# Patient Record
Sex: Male | Born: 1960
Health system: Southern US, Community
[De-identification: ages and names within clinical notes are randomized; demographics above are authoritative.]

## PROBLEM LIST (undated history)

## (undated) DIAGNOSIS — E785 Hyperlipidemia, unspecified: Secondary | ICD-10-CM

## (undated) DIAGNOSIS — I10 Essential (primary) hypertension: Secondary | ICD-10-CM

## (undated) DIAGNOSIS — R600 Localized edema: Secondary | ICD-10-CM

## (undated) DIAGNOSIS — M25579 Pain in unspecified ankle and joints of unspecified foot: Secondary | ICD-10-CM

## (undated) DIAGNOSIS — Z5189 Encounter for other specified aftercare: Secondary | ICD-10-CM

## (undated) DIAGNOSIS — Z86010 Personal history of colonic polyps: Secondary | ICD-10-CM

## (undated) DIAGNOSIS — Z0000 Encounter for general adult medical examination without abnormal findings: Secondary | ICD-10-CM

## (undated) DIAGNOSIS — K5790 Diverticulosis of intestine, part unspecified, without perforation or abscess without bleeding: Secondary | ICD-10-CM

## (undated) DIAGNOSIS — T7840XA Allergy, unspecified, initial encounter: Secondary | ICD-10-CM

## (undated) HISTORY — DX: Hyperlipidemia, unspecified: E78.5

## (undated) HISTORY — PX: OTHER SURGICAL HISTORY: SHX169

## (undated) HISTORY — DX: Personal history of colonic polyps: Z86.010

## (undated) HISTORY — DX: Localized edema: R60.0

## (undated) HISTORY — DX: Allergy, unspecified, initial encounter: T78.40XA

## (undated) HISTORY — DX: Diverticulosis of intestine, part unspecified, without perforation or abscess without bleeding: K57.90

## (undated) HISTORY — DX: Essential (primary) hypertension: I10

## (undated) HISTORY — DX: Pain in unspecified ankle and joints of unspecified foot: M25.579

## (undated) HISTORY — DX: Encounter for general adult medical examination without abnormal findings: Z00.00

## (undated) HISTORY — DX: Encounter for other specified aftercare: Z51.89

---
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
## 1983-05-25 DIAGNOSIS — Z5189 Encounter for other specified aftercare: Secondary | ICD-10-CM

## 1983-05-25 DIAGNOSIS — IMO0001 Reserved for inherently not codable concepts without codable children: Secondary | ICD-10-CM

## 1983-05-25 HISTORY — DX: Encounter for other specified aftercare: Z51.89

## 1983-05-25 HISTORY — PX: ORIF FEMUR FRACTURE- LISS PLATE: SHX2120

## 1983-05-25 HISTORY — PX: FRACTURE SURGERY: SHX138

## 1983-05-25 HISTORY — DX: Reserved for inherently not codable concepts without codable children: IMO0001

## 1998-07-07 ENCOUNTER — Observation Stay (HOSPITAL_COMMUNITY): Admission: EM | Admit: 1998-07-07 | Discharge: 1998-07-08 | Payer: Self-pay | Admitting: Emergency Medicine

## 1998-07-07 ENCOUNTER — Ambulatory Visit (HOSPITAL_COMMUNITY): Admission: RE | Admit: 1998-07-07 | Discharge: 1998-07-07 | Payer: Self-pay | Admitting: Orthopedic Surgery

## 1998-07-09 ENCOUNTER — Ambulatory Visit (HOSPITAL_COMMUNITY): Admission: RE | Admit: 1998-07-09 | Discharge: 1998-07-09 | Payer: Self-pay | Admitting: Unknown Physician Specialty

## 1998-07-10 ENCOUNTER — Encounter: Admission: RE | Admit: 1998-07-10 | Discharge: 1998-07-10 | Payer: Self-pay | Admitting: Family Medicine

## 1998-07-10 ENCOUNTER — Encounter (HOSPITAL_COMMUNITY): Admission: RE | Admit: 1998-07-10 | Discharge: 1998-10-08 | Payer: Self-pay

## 1998-07-14 ENCOUNTER — Encounter: Admission: RE | Admit: 1998-07-14 | Discharge: 1998-07-14 | Payer: Self-pay | Admitting: Family Medicine

## 1998-07-16 ENCOUNTER — Encounter: Admission: RE | Admit: 1998-07-16 | Discharge: 1998-07-16 | Payer: Self-pay | Admitting: Family Medicine

## 1998-07-21 ENCOUNTER — Encounter: Admission: RE | Admit: 1998-07-21 | Discharge: 1998-07-21 | Payer: Self-pay | Admitting: Family Medicine

## 1998-07-29 ENCOUNTER — Encounter: Admission: RE | Admit: 1998-07-29 | Discharge: 1998-07-29 | Payer: Self-pay | Admitting: Sports Medicine

## 1998-08-13 ENCOUNTER — Encounter: Admission: RE | Admit: 1998-08-13 | Discharge: 1998-08-13 | Payer: Self-pay | Admitting: Family Medicine

## 2009-07-02 ENCOUNTER — Ambulatory Visit: Payer: Self-pay | Admitting: Internal Medicine

## 2009-07-02 DIAGNOSIS — R030 Elevated blood-pressure reading, without diagnosis of hypertension: Secondary | ICD-10-CM | POA: Insufficient documentation

## 2009-07-02 DIAGNOSIS — R0989 Other specified symptoms and signs involving the circulatory and respiratory systems: Secondary | ICD-10-CM | POA: Insufficient documentation

## 2009-07-02 DIAGNOSIS — R0609 Other forms of dyspnea: Secondary | ICD-10-CM | POA: Insufficient documentation

## 2009-07-03 ENCOUNTER — Encounter: Payer: Self-pay | Admitting: Internal Medicine

## 2009-07-09 ENCOUNTER — Telehealth: Payer: Self-pay | Admitting: Internal Medicine

## 2009-07-09 ENCOUNTER — Ambulatory Visit: Payer: Self-pay | Admitting: Internal Medicine

## 2009-07-27 ENCOUNTER — Encounter: Payer: Self-pay | Admitting: Internal Medicine

## 2009-07-27 ENCOUNTER — Ambulatory Visit (HOSPITAL_BASED_OUTPATIENT_CLINIC_OR_DEPARTMENT_OTHER): Admission: RE | Admit: 2009-07-27 | Discharge: 2009-07-27 | Payer: Self-pay | Admitting: Internal Medicine

## 2009-08-05 ENCOUNTER — Ambulatory Visit: Payer: Self-pay | Admitting: Pulmonary Disease

## 2009-08-07 ENCOUNTER — Encounter (INDEPENDENT_AMBULATORY_CARE_PROVIDER_SITE_OTHER): Payer: Self-pay | Admitting: *Deleted

## 2009-08-07 ENCOUNTER — Telehealth: Payer: Self-pay | Admitting: Internal Medicine

## 2009-08-07 DIAGNOSIS — G4733 Obstructive sleep apnea (adult) (pediatric): Secondary | ICD-10-CM | POA: Insufficient documentation

## 2009-09-04 ENCOUNTER — Ambulatory Visit: Payer: Self-pay | Admitting: Pulmonary Disease

## 2010-06-21 LAB — CONVERTED CEMR LAB
ALT: 10 units/L (ref 0–53)
AST: 16 units/L (ref 0–37)
BUN: 12 mg/dL (ref 6–23)
Bilirubin, Direct: 0.1 mg/dL (ref 0.0–0.3)
CO2: 25 meq/L (ref 19–32)
Calcium: 9.4 mg/dL (ref 8.4–10.5)
Cholesterol: 138 mg/dL (ref 0–200)
Glucose, Bld: 84 mg/dL (ref 70–99)
HCT: 40.1 % (ref 39.0–52.0)
Hemoglobin: 13.5 g/dL (ref 13.0–17.0)
Indirect Bilirubin: 0.4 mg/dL (ref 0.0–0.9)
MCHC: 33.7 g/dL (ref 30.0–36.0)
MCV: 85.1 fL (ref 78.0–100.0)
PSA: 0.73 ng/mL (ref 0.10–4.00)
RBC: 4.71 M/uL (ref 4.22–5.81)
TSH: 1.377 microintl units/mL (ref 0.350–4.500)
Total Bilirubin: 0.5 mg/dL (ref 0.3–1.2)
Total CHOL/HDL Ratio: 3.5
WBC: 4.3 10*3/uL (ref 4.0–10.5)

## 2010-06-23 NOTE — Letter (Signed)
   [place] at [facility] [address] [place], [place]  [address]  [place] Phone: [phone]      July 03, 2009   Mitchell Harrington [address] [place], [place] [address]  RE:  LAB RESULTS  Dear  [name],  The following is an interpretation of your most recent lab tests.  Please take note of any instructions provided or changes to medications that have resulted from your lab work.  PSA:  normal - no follow-up needed PSA: 0.73  ELECTROLYTES:  Good - no changes needed  KIDNEY FUNCTION TESTS:  Good - no changes needed  LIVER FUNCTION TESTS:  Good - no changes needed  LIPID PANEL:  Good - no changes needed Triglyceride: 76   Cholesterol: 138   LDL: 84   HDL: 39   Chol/HDL%:  3.5 Ratio  THYROID STUDIES:  Thyroid studies normal TSH: 1.377     CBC:  Good - no changes needed   Sincerely Yours,    Dr. Thomos Lemons  Appended Document:  Mailed.
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
## 2010-06-23 NOTE — Progress Notes (Signed)
  Phone Note Outgoing Call   Summary of Call: call pt - stool test negative for blood Initial call taken by: [name],  July 09, 2009 11:59 AM  Follow-up for Phone Call        informed pt. that stool test was negative for any blood.Mitchell Harrington  July 09, 2009 1:43 PM

## 2010-06-23 NOTE — Letter (Signed)
Summary: [facility]: Immunoassay Fecal Occult Blood (iFOB) Order Psychologist, counselling at [facility]  [address]   [place], [place] [address]   Phone: [phone]  Fax: [phone]       Lab: Immunoassay Fecal Occult Blood (iFOB) Order Form   July 02, 2009 MRN: [id number]   Mitchell Harrington [birth year]   Physician Name: Dr Thomos Lemons  Diagnosis Code: V70.0      Mitchell Harrington CMA
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
## 2010-06-23 NOTE — Letter (Signed)
Summary: Primary Care Consult Scheduled Letter  [place] at [facility]  [address]   [place], [place] [address]   Phone: [phone]  Fax: [phone]      08/07/2009 MRN: [id number]  Mitchell Harrington [address] [place], [place]  [address]    Dear Mr. Prevette,      We have scheduled an appointment for you.  At the recommendation of Dr.YOO, we have scheduled you a consult with [facility], DR CLANCE  on APRIL 4,2011 at 11AM.  Their address is_[address] ,[place]. The office phone number is [phone].  If this appointment day and time is not convenient for you, please feel free to call the office of the doctor you are being referred to at the number listed above and reschedule the appointment.     It is important for you to keep your scheduled appointments. We are here to make sure you are given good patient care. If you have questions or you have made changes to your appointment, please notify us at  [phone], ask for HELEN.    Thank you,  Patient Care Coordinator [place] [facility]

## 2010-06-23 NOTE — Progress Notes (Signed)
Summary: Sleep Study  Phone Note Call from Patient Call back at [facility] Phone [phone]   Caller: Patient Call For: [name] Summary of Call: patient would like to know the results of his sleep study. His next appointment is 09/08/2009. Results are on Dr Artist Pais desk for review Initial call taken by: Glendell Docker CMA,  August 07, 2009 9:24 AM  Follow-up for Phone Call        sleep study shows severe obstructive sleep apnea.  I rec referral to sleep specialist Follow-up by: [name],  August 07, 2009 12:26 PM  Additional Follow-up for Phone Call Additional follow up Details #1::        Pt notified as directed   appt Dr   Shelle Iron  April 4 Additional Follow-up by: Darral Dash,  August 07, 2009 2:06 PM  New Problems: SLEEP APNEA, OBSTRUCTIVE (ICD-327.23)   Additional Follow-up for Phone Call Additional follow up Details #2::    can we send sleep referral to Dr. Vassie Loll Follow-up by: [name],  August 07, 2009 5:04 PM  Additional Follow-up for Phone Call Additional follow up Details #3:: Details for Additional Follow-up Action Taken: Changed appt to Dr Vassie Loll   [facility]  April 14  New Problems: SLEEP APNEA, OBSTRUCTIVE (ICD-327.23)

## 2011-07-12 ENCOUNTER — Ambulatory Visit (INDEPENDENT_AMBULATORY_CARE_PROVIDER_SITE_OTHER): Payer: BC Managed Care – PPO | Admitting: Internal Medicine

## 2011-07-12 ENCOUNTER — Encounter: Payer: Self-pay | Admitting: Internal Medicine

## 2011-07-12 DIAGNOSIS — N529 Male erectile dysfunction, unspecified: Secondary | ICD-10-CM | POA: Insufficient documentation

## 2011-07-12 DIAGNOSIS — I10 Essential (primary) hypertension: Secondary | ICD-10-CM

## 2011-07-12 MED ORDER — VARDENAFIL HCL 10 MG PO TABS: 10.0000 mg | ORAL_TABLET | Freq: Every day | ORAL | Status: DC | PRN

## 2011-07-12 NOTE — Assessment & Plan Note (Signed)
Asx. Maintain outpt bp log. Obtain cbc, chem7 and tsh. Schedule close f/u for review of bp log. If remains elevated discuss potential medication.

## 2011-07-12 NOTE — Assessment & Plan Note (Signed)
Obtain chem7 and testosterone. Possible contribution from elevated bp. Attempt levitra prn. Dosing instructions provided.

## 2011-07-12 NOTE — Progress Notes (Signed)
  Subjective:    Patient ID: Mitchell Harrington, male    DOB: 09/12/1960, 51 y.o.   MRN: [id number]  HPI Pt presents to clinic for followup of multiple medical problems. C/o one month h/o ED with difficulty achieving and sustaining erections. Has intact libido. No triggers known. H/o elevated bp and bp elevated today. States was nl at time of last cdl evaluation in July. Asx with regard to bp elevation and denies ha, dizziness, cp. Previously declined colonoscopy but may be willing to reconsider later this year. No there complaint.  No past medical history on file. No past surgical history on file.  reports that he has never smoked. He has never used smokeless tobacco. He reports that he does not drink alcohol or use illicit drugs. family history is not on file. Not on File    Review of Systems see hpi    Objective:   Physical Exam  Physical Exam  Nursing note and vitals reviewed. Constitutional: Appears well-developed and well-nourished. No distress.  HENT:  Head: Normocephalic and atraumatic.  Right Ear: External ear normal.  Left Ear: External ear normal.  Eyes: Conjunctivae are normal. No scleral icterus.  Neck: Neck supple. Carotid bruit is not present.  Cardiovascular: Normal rate, regular rhythm and normal heart sounds.  Exam reveals no gallop and no friction rub.   No murmur heard. Pulmonary/Chest: Effort normal and breath sounds normal. No respiratory distress. He has no wheezes. no rales.  Lymphadenopathy:    He has no cervical adenopathy.  Neurological:Alert.  Skin: Skin is warm and dry. Not diaphoretic.  Psychiatric: Has a normal mood and affect.        Assessment & Plan:

## 2011-07-13 LAB — CBC WITH DIFFERENTIAL/PLATELET
Hemoglobin: 13.7 g/dL (ref 13.0–17.0)
Lymphocytes Relative: 41 % (ref 12–46)
Lymphs Abs: 1.9 10*3/uL (ref 0.7–4.0)
MCH: 28.7 pg (ref 26.0–34.0)
MCV: 85.6 fL (ref 78.0–100.0)
Monocytes Relative: 7 % (ref 3–12)
Neutrophils Relative %: 48 % (ref 43–77)
Platelets: 247 10*3/uL (ref 150–400)
RBC: 4.78 MIL/uL (ref 4.22–5.81)
WBC: 4.6 10*3/uL (ref 4.0–10.5)

## 2011-07-13 LAB — BASIC METABOLIC PANEL
CO2: 28 mEq/L (ref 19–32)
Chloride: 103 mEq/L (ref 96–112)
Glucose, Bld: 92 mg/dL (ref 70–99)
Sodium: 140 mEq/L (ref 135–145)

## 2011-07-26 ENCOUNTER — Ambulatory Visit (INDEPENDENT_AMBULATORY_CARE_PROVIDER_SITE_OTHER): Payer: BC Managed Care – PPO | Admitting: Internal Medicine

## 2011-07-26 ENCOUNTER — Encounter: Payer: Self-pay | Admitting: Internal Medicine

## 2011-07-26 DIAGNOSIS — N529 Male erectile dysfunction, unspecified: Secondary | ICD-10-CM

## 2011-07-26 DIAGNOSIS — I10 Essential (primary) hypertension: Secondary | ICD-10-CM

## 2011-07-26 MED ORDER — AMLODIPINE BESYLATE 5 MG PO TABS: 5.0000 mg | ORAL_TABLET | Freq: Every day | ORAL | Status: DC

## 2011-07-26 NOTE — Assessment & Plan Note (Signed)
suboptimal control. Asx. Begin norvasc 5mg  po qd in addition to low sodium diet and regular exercise. Monitor bp as outpt and schedule close follow up for re-evaluation.

## 2011-07-26 NOTE — Assessment & Plan Note (Signed)
Stable. Not yet attempted levitra prn. Testosterone level nl. Possible contribution from htn

## 2011-07-26 NOTE — Progress Notes (Signed)
  Subjective:    Patient ID: Mitchell Harrington, male    DOB: 05/28/1960, 51 y.o.   MRN: [id number]  HPI Pt presents to clinic for followup of multiple medical problems. bp log reviewed with 141/82, 152/101 and 151/98 values. Remains asx without ha, dizziness, neurologic sx, cp or dyspnea. Recent chem7, cbc, tsh and testosterone nl. H/o ED and did not attempt levitra yet. Still considering colonoscopy-possible later in the year.   No past medical history on file. No past surgical history on file.  reports that he has never smoked. He has never used smokeless tobacco. He reports that he does not drink alcohol or use illicit drugs. family history is not on file. Not on File    Review of Systems see hpi     Objective:   Physical Exam  Physical Exam  Nursing note and vitals reviewed. Constitutional: Appears well-developed and well-nourished. No distress.  HENT:  Head: Normocephalic and atraumatic.  Right Ear: External ear normal.  Left Ear: External ear normal.  Eyes: Conjunctivae are normal. No scleral icterus.  Neck: Neck supple. Carotid bruit is not present.  Cardiovascular: Normal rate, regular rhythm and normal heart sounds.  Exam reveals no gallop and no friction rub.   No murmur heard. Pulmonary/Chest: Effort normal and breath sounds normal. No respiratory distress. He has no wheezes. no rales.  Lymphadenopathy:    He has no cervical adenopathy.  Neurological:Alert.  Skin: Skin is warm and dry. Not diaphoretic.  Psychiatric: Has a normal mood and affect.        Assessment & Plan:

## 2011-08-30 ENCOUNTER — Ambulatory Visit (INDEPENDENT_AMBULATORY_CARE_PROVIDER_SITE_OTHER): Payer: BC Managed Care – PPO | Admitting: Internal Medicine

## 2011-08-30 ENCOUNTER — Encounter: Payer: Self-pay | Admitting: Internal Medicine

## 2011-08-30 VITALS — BP 120/80 | HR 63 | Temp 97.9°F | Resp 16 | Ht 71.0 in | Wt 198.0 lb

## 2011-08-30 DIAGNOSIS — I10 Essential (primary) hypertension: Secondary | ICD-10-CM

## 2011-09-01 NOTE — Progress Notes (Signed)
  Subjective:    Patient ID: Mitchell Harrington, male    DOB: 14-Sep-1960, 51 y.o.   MRN: [id number]  HPI Pt presents to clinic for followup of multiple medical problems. bp reviewed improved on normal taking norvasc. Tolerating medication without edema or other complaint. Reminded need for screening colonoscopy. Understands colorectal cancer screening guidelines and wishes to potentially schedule next visit.  No past medical history on file. No past surgical history on file.  reports that he has never smoked. He has never used smokeless tobacco. He reports that he does not drink alcohol or use illicit drugs. family history is not on file. Not on File    Review of Systems see hpi     Objective:   Physical Exam  Physical Exam  Nursing note and vitals reviewed. Constitutional: Appears well-developed and well-nourished. No distress.  HENT:  Head: Normocephalic and atraumatic.  Right Ear: External ear normal.  Left Ear: External ear normal.  Eyes: Conjunctivae are normal. No scleral icterus.  Neck: Neck supple. Carotid bruit is not present.  Cardiovascular: Normal rate, regular rhythm and normal heart sounds.  Exam reveals no gallop and no friction rub.   No murmur heard. Pulmonary/Chest: Effort normal and breath sounds normal. No respiratory distress. He has no wheezes. no rales.  Lymphadenopathy:    He has no cervical adenopathy.  Neurological:Alert.  Skin: Skin is warm and dry. Not diaphoretic.  Psychiatric: Has a normal mood and affect.        Assessment & Plan:

## 2011-09-01 NOTE — Assessment & Plan Note (Signed)
Improved control. Continue norvasc at current dosing. Recommend exercise and wt loss

## 2011-11-29 ENCOUNTER — Ambulatory Visit: Payer: BC Managed Care – PPO | Admitting: Internal Medicine

## 2011-12-27 ENCOUNTER — Ambulatory Visit (INDEPENDENT_AMBULATORY_CARE_PROVIDER_SITE_OTHER): Payer: BC Managed Care – PPO | Admitting: Internal Medicine

## 2011-12-27 ENCOUNTER — Encounter: Payer: Self-pay | Admitting: Internal Medicine

## 2011-12-27 VITALS — BP 116/80 | HR 62 | Temp 98.3°F | Resp 16 | Ht 71.0 in | Wt 198.5 lb

## 2011-12-27 DIAGNOSIS — Z1211 Encounter for screening for malignant neoplasm of colon: Secondary | ICD-10-CM | POA: Insufficient documentation

## 2011-12-27 DIAGNOSIS — Z125 Encounter for screening for malignant neoplasm of prostate: Secondary | ICD-10-CM

## 2011-12-27 DIAGNOSIS — I10 Essential (primary) hypertension: Secondary | ICD-10-CM

## 2011-12-27 LAB — PSA: PSA: 0.94 ng/mL (ref <=4.00)

## 2011-12-27 LAB — LIPID PANEL
Cholesterol: 142 mg/dL (ref 0–200)
HDL: 38 mg/dL — ABNORMAL LOW (ref >39)
Total CHOL/HDL Ratio: 3.7 Ratio

## 2011-12-27 NOTE — Progress Notes (Signed)
  Subjective:    Patient ID: Mitchell Harrington, male    DOB: [birth year], 51 y.o.   MRN: [id number]  HPI Pt presents to clinic for followup of multiple medical problems. BP reviewed as normotensive. Home monitoring has been normal as well. Tolerating norvasc without le swelling or other complaint. Ready to proceed with screening colonoscopy. No change in bowel habits or blood in stool.   PMH: HTN, OSA,  ED No past surgical history on file.  reports that he has never smoked. He has never used smokeless tobacco. He reports that he does not drink alcohol or use illicit drugs. family history is not on file. No Known Allergies    Review of Systems see hpi     Objective:   Physical Exam  Physical Exam  Nursing note and vitals reviewed. Constitutional: Appears well-developed and well-nourished. No distress.  HENT:  Head: Normocephalic and atraumatic.  Right Ear: External ear normal.  Left Ear: External ear normal.  Eyes: Conjunctivae are normal. No scleral icterus.  Neck: Neck supple. Carotid bruit is not present.  Cardiovascular: Normal rate, regular rhythm and normal heart sounds.  Exam reveals no gallop and no friction rub.   No murmur heard. Pulmonary/Chest: Effort normal and breath sounds normal. No respiratory distress. He has no wheezes. no rales.  Lymphadenopathy:    He has no cervical adenopathy.  Neurological:Alert.  Skin: Skin is warm and dry. Not diaphoretic.  Psychiatric: Has a normal mood and affect.        Assessment & Plan:

## 2011-12-27 NOTE — Assessment & Plan Note (Signed)
GI referral for screening colonoscopy.

## 2011-12-27 NOTE — Assessment & Plan Note (Addendum)
Normotensive and stable. Continue current regimen. Monitor bp as outpt and followup in clinic as scheduled. Obtain lipid profile. Schedule alternating 80m f/u with cpe.

## 2012-01-10 ENCOUNTER — Ambulatory Visit (AMBULATORY_SURGERY_CENTER): Payer: BC Managed Care – PPO | Admitting: *Deleted

## 2012-01-10 ENCOUNTER — Encounter: Payer: Self-pay | Admitting: Internal Medicine

## 2012-01-10 VITALS — Ht 71.0 in | Wt 192.0 lb

## 2012-01-10 DIAGNOSIS — Z1211 Encounter for screening for malignant neoplasm of colon: Secondary | ICD-10-CM

## 2012-01-10 MED ORDER — SUPREP BOWEL PREP KIT 17.5-3.13-1.6 GM/177ML PO SOLN: ORAL | Status: DC

## 2012-01-17 ENCOUNTER — Ambulatory Visit (AMBULATORY_SURGERY_CENTER): Payer: BC Managed Care – PPO | Admitting: Internal Medicine

## 2012-01-17 ENCOUNTER — Encounter: Payer: Self-pay | Admitting: Internal Medicine

## 2012-01-17 VITALS — BP 125/83 | HR 65 | Temp 97.0°F | Resp 12 | Ht 71.0 in | Wt 192.0 lb

## 2012-01-17 DIAGNOSIS — Z1211 Encounter for screening for malignant neoplasm of colon: Secondary | ICD-10-CM

## 2012-01-17 DIAGNOSIS — D126 Benign neoplasm of colon, unspecified: Secondary | ICD-10-CM

## 2012-01-17 MED ORDER — SODIUM CHLORIDE 0.9 % IV SOLN: 500.0000 mL | INTRAVENOUS | Status: DC

## 2012-01-17 NOTE — Patient Instructions (Addendum)
YOU HAD AN ENDOSCOPIC PROCEDURE TODAY AT THE [facility]: Refer to the procedure report that was given to you for any specific questions about what was found during the examination.  If the procedure report does not answer your questions, please call your gastroenterologist to clarify.  If you requested that your care partner not be given the details of your procedure findings, then the procedure report has been included in a sealed envelope for you to review at your convenience later.  YOU SHOULD EXPECT: Some feelings of bloating in the abdomen. Passage of more gas than usual.  Walking can help get rid of the air that was put into your GI tract during the procedure and reduce the bloating. If you had a lower endoscopy (such as a colonoscopy or flexible sigmoidoscopy) you may notice spotting of blood in your stool or on the toilet paper. If you underwent a bowel prep for your procedure, then you may not have a normal bowel movement for a few days.  DIET: Your first meal following the procedure should be a light meal and then it is ok to progress to your normal diet.  A half-sandwich or bowl of soup is an example of a good first meal.  Heavy or fried foods are harder to digest and may make you feel nauseous or bloated.  Likewise meals heavy in dairy and vegetables can cause extra gas to form and this can also increase the bloating.  Drink plenty of fluids but you should avoid alcoholic beverages for 24 hours.  ACTIVITY: Your care partner should take you home directly after the procedure.  You should plan to take it easy, moving slowly for the rest of the day.  You can resume normal activity the day after the procedure however you should NOT DRIVE or use heavy machinery for 24 hours (because of the sedation medicines used during the test).    SYMPTOMS TO REPORT IMMEDIATELY: A gastroenterologist can be reached at any hour.  During normal business hours, 8:30 AM to 5:00 PM Monday through Friday,  call [phone].  After hours and on weekends, please call the GI answering service at [phone] who will take a message and have the physician on call contact you.   Following lower endoscopy (colonoscopy or flexible sigmoidoscopy):  Excessive amounts of blood in the stool  Significant tenderness or worsening of abdominal pains  Swelling of the abdomen that is new, acute  Fever of 100F or higher FOLLOW UP: If any biopsies were taken you will be contacted by phone or by letter within the next 1-3 weeks.  Call your gastroenterologist if you have not heard about the biopsies in 3 weeks.  Our staff will call the home number listed on your records the next business day following your procedure to check on you and address any questions or concerns that you may have at that time regarding the information given to you following your procedure. This is a courtesy call and so if there is no answer at the home number and we have not heard from you through the emergency physician on call, we will assume that you have returned to your regular daily activities without incident.  SIGNATURES/CONFIDENTIALITY: You and/or your care partner have signed paperwork which will be entered into your electronic medical record.  These signatures attest to the fact that that the information above on your After Visit Summary has been reviewed and is understood.  Full responsibility of the confidentiality of this discharge   information lies with you and/or your care-partner.   Ok to resume your normal medications Follow a high fiber diet- see handout 

## 2012-01-17 NOTE — Op Note (Signed)
[facility] [address] [place], [address]   COLONOSCOPY PROCEDURE REPORT  PATIENT: Mitchell, Harrington  MR#: [id number] BIRTHDATE: 1960/10/10 , 50  yrs. old GENDER: Male ENDOSCOPIST: Beverley Fiedler, MD REFERRED AO:[id number], Maisie Fus PROCEDURE DATE:  01/17/2012 PROCEDURE:   Colonoscopy with cold biopsy polypectomy ASA CLASS:   Class II INDICATIONS:average risk screening and first colonoscopy. MEDICATIONS: Propofol (Diprivan) 220 mg IV and MAC sedation, administered by CRNA  DESCRIPTION OF PROCEDURE:   After the risks benefits and alternatives of the procedure were thoroughly explained, informed consent was obtained.  A digital rectal exam revealed no rectal mass.   The LB [id number]  endoscope was introduced through the anus and advanced to the terminal ileum which was intubated for a short distance. No adverse events experienced.   The quality of the prep was Suprep excellent  The instrument was then slowly withdrawn as the colon was fully examined.   COLON FINDINGS: The mucosa appeared normal in the terminal ileum. A sessile polyp measuring 2 mm in size was found in the rectum.  A polypectomy was performed with cold forceps.  The resection was complete and the polyp tissue was completely retrieved.   Mild diverticulosis was noted The finding was in the left colon. Retroflexed views revealed no abnormalities. The time to cecum=2 minutes 31 seconds  Withdrawal time=12 minutes 03 seconds.  The scope was withdrawn and the procedure completed. COMPLICATIONS: There were no complications.  ENDOSCOPIC IMPRESSION: 1.   Normal mucosa in the terminal ileum 2.   Sessile polyp measuring 2 mm in size was found in the rectum; polypectomy was performed with cold forceps 3.   Mild diverticulosis was noted in the left colon  RECOMMENDATIONS: 1.  Await pathology results 2.  High fiber diet 3.  If the polyp removed today is proven to be adenomatous (pre-cancerous)  polyp, you will need a repeat colonoscopy in 5 years.  Otherwise you should continue to follow colorectal cancer screening guidelines for "routine risk" patients with colonoscopy in 10 years.  You will receive a letter within 1-2 weeks with the results of your biopsy as well as final recommendations.  Please call my office if you have not received a letter after 3 weeks.  eSigned:  Beverley Fiedler, MD 01/17/2012 11:18 AM   cc: Charlynn Court MD and The Patient   PATIENT NAME:  Mitchell, Harrington MR#: [id number]
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
## 2012-01-17 NOTE — Progress Notes (Signed)
The pt tolerated the colonoscopy very well. Maw   

## 2012-01-18 ENCOUNTER — Telehealth: Payer: Self-pay

## 2012-01-18 NOTE — Telephone Encounter (Signed)
  Follow up Call-  Call back number 01/17/2012  Post procedure Call Back phone  # [phone]  Permission to leave phone message Yes     Patient questions:  Do you have a fever, pain , or abdominal swelling? no Pain Score  0 *  Have you tolerated food without any problems? yes  Have you been able to return to your normal activities? yes  Do you have any questions about your discharge instructions: Diet   no Medications  no Follow up visit  no  Do you have questions or concerns about your Care? no  Actions: * If pain score is 4 or above: No action needed, pain <4.  No problems per the pt. Maw

## 2012-01-25 ENCOUNTER — Encounter: Payer: Self-pay | Admitting: Internal Medicine

## 2012-02-09 ENCOUNTER — Telehealth: Payer: Self-pay | Admitting: Internal Medicine

## 2012-02-09 MED ORDER — AMLODIPINE BESYLATE 5 MG PO TABS: 5.0000 mg | ORAL_TABLET | Freq: Every day | ORAL | Status: DC

## 2012-02-09 NOTE — Telephone Encounter (Signed)
Refill- amlodipine besylate 5mg  tablets. Take one tablet by mouth daily. Qty 30 last fill 8.23.13

## 2012-02-09 NOTE — Telephone Encounter (Signed)
Rx done/SLS 

## 2012-07-24 ENCOUNTER — Ambulatory Visit: Payer: Self-pay | Admitting: Family Medicine

## 2012-08-14 ENCOUNTER — Ambulatory Visit: Payer: Self-pay | Admitting: Family Medicine

## 2012-09-25 ENCOUNTER — Ambulatory Visit: Payer: Self-pay | Admitting: Family Medicine

## 2012-11-20 ENCOUNTER — Ambulatory Visit (HOSPITAL_BASED_OUTPATIENT_CLINIC_OR_DEPARTMENT_OTHER)
Admission: RE | Admit: 2012-11-20 | Discharge: 2012-11-20 | Disposition: A | Discharge status: Home / Self Care | Payer: BC Managed Care – PPO | Source: Ambulatory Visit | Attending: Family Medicine | Admitting: Family Medicine

## 2012-11-20 ENCOUNTER — Encounter: Payer: Self-pay | Admitting: Family Medicine

## 2012-11-20 ENCOUNTER — Ambulatory Visit (INDEPENDENT_AMBULATORY_CARE_PROVIDER_SITE_OTHER): Payer: BC Managed Care – PPO | Admitting: Family Medicine

## 2012-11-20 ENCOUNTER — Ambulatory Visit: Payer: BC Managed Care – PPO

## 2012-11-20 VITALS — BP 128/82 | HR 64 | Temp 98.1°F | Ht 71.0 in | Wt 196.1 lb

## 2012-11-20 DIAGNOSIS — R609 Edema, unspecified: Secondary | ICD-10-CM

## 2012-11-20 DIAGNOSIS — Z86718 Personal history of other venous thrombosis and embolism: Secondary | ICD-10-CM | POA: Insufficient documentation

## 2012-11-20 DIAGNOSIS — M7989 Other specified soft tissue disorders: Secondary | ICD-10-CM

## 2012-11-20 DIAGNOSIS — I10 Essential (primary) hypertension: Secondary | ICD-10-CM

## 2012-11-20 DIAGNOSIS — R600 Localized edema: Secondary | ICD-10-CM

## 2012-11-20 HISTORY — DX: Localized edema: R60.0

## 2012-11-20 MED ORDER — AMLODIPINE BESYLATE 2.5 MG PO TABS: 2.5000 mg | ORAL_TABLET | Freq: Every day | ORAL | Status: DC

## 2012-11-20 NOTE — Assessment & Plan Note (Signed)
Well-controlled. Patient has tried to watch his diet and exercise more. Has been taking his amlodipine every other day 5 mg. Will change him to 2.5 mg daily. Agrees to return for annual labs and annual exam in 3 months time.
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
## 2012-11-20 NOTE — Assessment & Plan Note (Signed)
Check Ultrasound today, if neg start 81 mg aspirin and warned to stop and walk frequently while driving, if positive for DVT will start Xarelto at 15 mg daily x 21 days then 20 mg daily. Would be second DVT

## 2012-11-20 NOTE — Patient Instructions (Addendum)
If ultrasound negative for DVT start Aspirin 81 mg daily  Next appt annual with labs prior lipid, renal, cbc, tsh, hepatic, psa   Deep Vein Thrombosis A deep vein thrombosis (DVT) is a blood clot that develops in a deep vein. A DVT is a clot in the deep, larger veins of the leg, arm, or pelvis. These are more dangerous than clots that might form in veins near the surface of the body. A DVT can lead to complications if the clot breaks off and travels in the bloodstream to the lungs.  A DVT can damage the valves in your leg veins, so that instead of flowing upwards, the blood pools in the lower leg. This is called post-thrombotic syndrome, and can result in pain, swelling, discoloration, and sores on the leg. Once identified, a DVT can be treated. It can also be prevented in some circumstances. Once you have had a DVT, you may be at increased risk for a DVT in the future. CAUSES Blood clots form in a vein for different reasons. Usually several things contribute to blood clots. Contributing factors include:  The flow of blood slows down.  The inside of the vein is damaged in some way.  The person has a condition that makes blood clot more easily. Some people are more likely than others to develop blood clots. That is because they have more factors that make clots likely. These are called risk factors. Risk factors include:   Older age, especially over 29 years old.  Having a history of blood clots. This means you have had one before. Or, it means that someone else in your family has had blood clots. You may have a genetic tendency to form clots.  Having major or lengthy surgery. This is especially true for surgery on the hip, knee, or belly (abdomen). Hip surgery is particularly high risk.  Breaking a hip or leg.  Sitting or lying still for a long time. This includes long distance travel, paralysis, or recovery from an illness or surgery.  Cancer, or cancer treatment.  Having a long, thin  tube (catheter) placed inside a vein during a medical procedure.  Being overweight (obese).  Pregnancy and childbirth. Hormone changes make the blood clot more easily during pregnancy. The fetus puts pressure on the veins of the pelvis. There is also risk of injury to veins during delivery or a caesarean. The risk is at its highest just after childbirth.  Medicines with the male hormone estrogen. This includes birth control pills and hormone replacement therapy.  Smoking.  Other circulation or heart problems. SYMPTOMS When a clot forms, it can either partially or totally block the blood flow in that vein. Symptoms of a DVT can include:  Swelling of the leg or arm, especially if one side is much worse.  Warmth and redness of the leg or arm, especially if one side is much worse.  Pain in an arm or leg. If the clot is in the leg, symptoms may be more noticeable or worse when standing or walking. The symptoms of a DVT that has traveled to the lungs (pulmonary embolism, PE) usually start suddenly, and include:  Shortness of breath.  Coughing.  Coughing up blood or blood-tinged phlegm.  Chest pain. The chest pain is often worse with deep breaths.  Rapid heartbeat. Anyone with these symptoms should get emergency medical treatment right away. Call your local emergency services (911 in [place] if you have these symptoms. DIAGNOSIS If a DVT is suspected, your caregiver  will take a full medical history and carry out a physical exam. Tests that also may be required include:  Blood tests, including studies of the clotting properties of the blood.  Ultrasonography to see if you have clots in your legs or lungs.  X-rays to show the flow of blood when dye is injected into the veins (venography).  Studies of your lungs, if you have any chest symptoms. PREVENTION  Exercise the legs regularly. Take a brisk 30 minute walk every day.  Maintain a weight that is appropriate for your  height.  Avoid sitting or lying in bed for long periods of time without moving your legs.  Women, particularly those over the age of 22, should consider the risks and benefits of taking estrogen medicines, including birth control pills.  Do not smoke, especially if you take estrogen medicines.  Long distance travel can increase your risk of DVT. You should exercise your legs by walking or pumping the muscles every hour.  In-hospital prevention:  Many of the risk factors above relate to situations that exist with hospitalization, either for illness, injury, or elective surgery.  Your caregiver will assess you for the need for venous thromboembolism prophylaxis when you are admitted to the hospital. If you are having surgery, your surgeon will assess you the day of or day after surgery.  Prevention may include medical and nonmedical measures. TREATMENT Treatment for DVT helps prevent death and disability. The most common treatment for DVT is blood thinning (anticoagulant) medicine, which reduces the blood's tendency to clot. Anticoagulants can stop new blood clots from forming and old ones from growing. They cannot dissolve existing clots. Your body does this by itself over time. Anticoagulants can be given by mouth, by intravenous (IV) access, or by injection. Your caregiver will determine the best program for you.  Heparin or related medicines (low molecular weight heparin) are usually the first treatment for a blood clot. They act quickly. However, they cannot be taken orally.  Heparin can cause a fall in a component of blood that stops bleeding and forms blood clots (platelets). You will be monitored with blood tests to be sure this does not occur.  Warfarin is an anticoagulant that can be swallowed (taken orally). It takes a few days to start working, so usually heparin or related medicines are used in combination. Once warfarin is working, heparin is usually stopped.  Less commonly,  clot dissolving drugs (thrombolytics) are used to dissolve a DVT. They carry a high risk of bleeding, so they are used mainly in severe cases, where a life or limb is threatened.  Very rarely, a blood clot in the leg needs to be removed surgically.  If you are unable to take anticoagulants, your caregiver may arrange for you to have a filter placed in a main vein in your belly (abdomen). This filter prevents clots from traveling to your lungs. HOME CARE INSTRUCTIONS  Take all medicines prescribed by your caregiver. Follow the directions carefully.  Warfarin. Most people will continue taking warfarin after hospital discharge. Your caregiver will advise you on the length of treatment (usually 3 6 months, sometimes lifelong).  Too much and too little warfarin are both dangerous. Too much warfarin increases the risk of bleeding. Too little warfarin continues to allow the risk for blood clots. While taking warfarin, you will need to have regular blood tests to measure your blood clotting time. These blood tests usually include both the prothrombin time (PT) and international normalized ratio (INR) tests. The  PT and INR results allow your caregiver to adjust your dose of warfarin. The dose can change for many reasons. It is critically important that you take warfarin exactly as prescribed, and that you have your PT and INR levels drawn exactly as directed.  Many foods, especially foods high in vitamin K can interfere with warfarin and affect the PT and INR results. Foods high in vitamin K include spinach, kale, broccoli, cabbage, collard and turnip greens, brussels sprouts, peas, cauliflower, seaweed, and parsley as well as beef and pork liver, green tea, and soybean oil. You should eat a consistent amount of foods high in vitamin K. Avoid major changes in your diet, or notify your caregiver before changing your diet. Arrange a visit with a dietitian to answer your questions.  Many medicines can interfere  with warfarin and affect the PT and INR results. You must tell your caregiver about any and all medicines you take, this includes all vitamins and supplements. Be especially cautious with aspirin and anti-inflammatory medicines. Ask your caregiver before taking these. Do not take or discontinue any prescribed or over-the-counter medicine except on the advice of your caregiver or pharmacist.  Warfarin can have side effects, primarily excessive bruising or bleeding. You will need to hold pressure over cuts for longer than usual. Your caregiver or pharmacist will discuss other potential side effects.  Alcohol can change the body's ability to handle warfarin. It is best to avoid alcoholic drinks or consume only very small amounts while taking warfarin. Notify your caregiver if you change your alcohol intake.  Notify your dentist or other caregivers before procedures.  Activity. Ask your caregiver how soon you can go back to normal activities. It is important to stay active to prevent blood clots. If you are on anticoagulant medicine, avoid contact sports.  Exercise. It is very important to exercise. This is especially important while traveling, sitting or standing for long periods of time. Exercise your legs by walking or by pumping the muscles frequently. Take frequent walks.  Compression stockings. These are tight elastic stockings that apply pressure to the lower legs. This pressure can help keep the blood in the legs from clotting. You may need to wear compressions stockings at home to help prevent a DVT.  Smoking. If you smoke, quit. Ask your caregiver for help with quitting smoking.  Learn as much as you can about DVT. Knowing more about the condition should help you keep it from coming back.  Wear a medical alert bracelet or carry a medical alert card. SEEK MEDICAL CARE IF:  You notice a rapid heartbeat.  You feel weaker or more tired than usual.  You feel faint.  You notice increased  bruising.  You feel your symptoms are not getting better in the time expected.  You believe you are having side effects of medicine. SEEK IMMEDIATE MEDICAL CARE IF:  You have chest pain.  You have trouble breathing.  You have new or increased swelling or pain in one leg.  You cough up blood.  You notice blood in vomit, in a bowel movement, or in urine. MAKE SURE YOU:  Understand these instructions.  Will watch your condition.  Will get help right away if you are not doing well or get worse. Document Released: 05/10/2005 Document Revised: 02/02/2012 Document Reviewed: 07/02/2010 [facility] Patient Information 2014 [place], [place].

## 2013-02-19 ENCOUNTER — Encounter: Payer: BC Managed Care – PPO | Admitting: Family Medicine

## 2013-04-02 ENCOUNTER — Encounter: Payer: Self-pay | Admitting: Family Medicine

## 2013-04-02 ENCOUNTER — Ambulatory Visit (INDEPENDENT_AMBULATORY_CARE_PROVIDER_SITE_OTHER): Payer: BC Managed Care – PPO | Admitting: Family Medicine

## 2013-04-02 VITALS — BP 120/86 | HR 64 | Temp 98.2°F | Resp 16 | Ht 71.0 in | Wt 196.0 lb

## 2013-04-02 DIAGNOSIS — R600 Localized edema: Secondary | ICD-10-CM

## 2013-04-02 DIAGNOSIS — Z860100 Personal history of colon polyps, unspecified: Secondary | ICD-10-CM

## 2013-04-02 DIAGNOSIS — K5730 Diverticulosis of large intestine without perforation or abscess without bleeding: Secondary | ICD-10-CM

## 2013-04-02 DIAGNOSIS — R351 Nocturia: Secondary | ICD-10-CM

## 2013-04-02 DIAGNOSIS — E785 Hyperlipidemia, unspecified: Secondary | ICD-10-CM

## 2013-04-02 DIAGNOSIS — I10 Essential (primary) hypertension: Secondary | ICD-10-CM

## 2013-04-02 DIAGNOSIS — R609 Edema, unspecified: Secondary | ICD-10-CM

## 2013-04-02 DIAGNOSIS — K5790 Diverticulosis of intestine, part unspecified, without perforation or abscess without bleeding: Secondary | ICD-10-CM

## 2013-04-02 DIAGNOSIS — Z0000 Encounter for general adult medical examination without abnormal findings: Secondary | ICD-10-CM

## 2013-04-02 HISTORY — DX: Diverticulosis of intestine, part unspecified, without perforation or abscess without bleeding: K57.90

## 2013-04-02 HISTORY — DX: Personal history of colon polyps, unspecified: Z86.0100

## 2013-04-02 HISTORY — DX: Encounter for general adult medical examination without abnormal findings: Z00.00

## 2013-04-02 HISTORY — DX: Hyperlipidemia, unspecified: E78.5

## 2013-04-02 HISTORY — DX: Personal history of colonic polyps: Z86.010

## 2013-04-02 LAB — CBC
MCH: 29 pg (ref 26.0–34.0)
MCV: 84.7 fL (ref 78.0–100.0)
Platelets: 252 10*3/uL (ref 150–400)
RDW: 14 % (ref 11.5–15.5)
WBC: 4.4 10*3/uL (ref 4.0–10.5)

## 2013-04-02 MED ORDER — AMLODIPINE BESYLATE 2.5 MG PO TABS: 2.5000 mg | ORAL_TABLET | Freq: Every day | ORAL | Status: DC

## 2013-04-02 NOTE — Assessment & Plan Note (Signed)
Persistent, nontender, L>R, encouraged watch sodium and report worsening symptoms
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
## 2013-04-02 NOTE — Assessment & Plan Note (Signed)
Mild on recent colponoscopy, encouraged hi fiber diet and add a fiber supplement. Add a probiotic and increase hydration

## 2013-04-02 NOTE — Assessment & Plan Note (Signed)
Offered flu shot and pneumonia shot today and encouraged to consider Tdap if small baby enters his life. Declines all for now. Encouraged heart healthy diet and exercise as tolerated.

## 2013-04-02 NOTE — Assessment & Plan Note (Signed)
Well controlled on Amlodipine 2.5 mg given refill today

## 2013-04-02 NOTE — Patient Instructions (Signed)
Consider a probiotic daily such as Digestive Advantage Consider a krill oil cap daily, such as MegaRed daily Add a fiber supplement such as Benefiber or Metamucil, need 64 oz clear fluids a day   Diverticulosis Diverticulosis is a common condition that develops when small pouches (diverticula) form in the wall of the colon. The risk of diverticulosis increases with age. It happens more often in people who eat a low-fiber diet. Most individuals with diverticulosis have no symptoms. Those individuals with symptoms usually experience abdominal pain, constipation, or loose stools (diarrhea). HOME CARE INSTRUCTIONS   Increase the amount of fiber in your diet as directed by your caregiver or dietician. This may reduce symptoms of diverticulosis.  Your caregiver may recommend taking a dietary fiber supplement.  Drink at least 6 to 8 glasses of water each day to prevent constipation.  Try not to strain when you have a bowel movement.  Your caregiver may recommend avoiding nuts and seeds to prevent complications, although this is still an uncertain benefit.  Only take over-the-counter or prescription medicines for pain, discomfort, or fever as directed by your caregiver. FOODS WITH HIGH FIBER CONTENT INCLUDE:  Fruits. Apple, peach, pear, tangerine, raisins, prunes.  Vegetables. Brussels sprouts, asparagus, broccoli, cabbage, carrot, cauliflower, romaine lettuce, spinach, summer squash, tomato, winter squash, zucchini.  Starchy Vegetables. Baked beans, kidney beans, lima beans, split peas, lentils, potatoes (with skin).  Grains. Whole wheat bread, brown rice, bran flake cereal, plain oatmeal, white rice, shredded wheat, bran muffins. SEEK IMMEDIATE MEDICAL CARE IF:   You develop increasing pain or severe bloating.  You have an oral temperature above 102 F (38.9 C), not controlled by medicine.  You develop vomiting or bowel movements that are bloody or black. Document Released: 02/05/2004  Document Revised: 08/02/2011 Document Reviewed: 10/08/2009 [facility] Patient Information 2014 [place], [place].

## 2013-04-02 NOTE — Assessment & Plan Note (Signed)
Mildly low hdl, will repeat today, encouraged krill oil caps daily

## 2013-04-03 ENCOUNTER — Telehealth: Payer: Self-pay

## 2013-04-03 LAB — HEPATIC FUNCTION PANEL
ALT: 13 U/L (ref 0–53)
AST: 17 U/L (ref 0–37)
Albumin: 4.3 g/dL (ref 3.5–5.2)
Alkaline Phosphatase: 60 U/L (ref 39–117)
Bilirubin, Direct: 0.1 mg/dL (ref 0.0–0.3)
Indirect Bilirubin: 0.5 mg/dL (ref 0.0–0.9)
Total Bilirubin: 0.6 mg/dL (ref 0.3–1.2)
Total Protein: 7.3 g/dL (ref 6.0–8.3)

## 2013-04-03 LAB — RENAL FUNCTION PANEL
Albumin: 4.3 g/dL (ref 3.5–5.2)
Chloride: 102 mEq/L (ref 96–112)
Creat: 0.97 mg/dL (ref 0.50–1.35)
Phosphorus: 3 mg/dL (ref 2.3–4.6)

## 2013-04-03 LAB — LIPID PANEL
HDL: 45 mg/dL (ref >39)
Triglycerides: 93 mg/dL (ref <150)

## 2013-04-03 LAB — PSA: PSA: 1.1 ng/mL (ref <=4.00)

## 2013-04-03 LAB — TSH: TSH: 1.452 u[IU]/mL (ref 0.350–4.500)

## 2013-04-03 NOTE — Telephone Encounter (Signed)
Patient informed of results.   MET LPN

## 2013-04-03 NOTE — Telephone Encounter (Signed)
Message copied by Eulis Manly on Tue Apr 03, 2013  9:47 AM ------      Message from: Danise Edge A      Created: Tue Apr 03, 2013  9:26 AM       Notify labs all normal ------

## 2013-10-01 ENCOUNTER — Ambulatory Visit: Payer: BC Managed Care – PPO | Admitting: Family Medicine

## 2013-10-22 ENCOUNTER — Ambulatory Visit (INDEPENDENT_AMBULATORY_CARE_PROVIDER_SITE_OTHER): Payer: BC Managed Care – PPO | Admitting: Family Medicine

## 2013-10-22 ENCOUNTER — Encounter: Payer: Self-pay | Admitting: Family Medicine

## 2013-10-22 VITALS — BP 122/80 | HR 63 | Temp 98.3°F | Ht 71.0 in | Wt 200.1 lb

## 2013-10-22 DIAGNOSIS — I10 Essential (primary) hypertension: Secondary | ICD-10-CM

## 2013-10-22 DIAGNOSIS — E785 Hyperlipidemia, unspecified: Secondary | ICD-10-CM

## 2013-10-22 NOTE — Patient Instructions (Signed)
DASH Diet  The DASH diet stands for "Dietary Approaches to Stop Hypertension." It is a healthy eating plan that has been shown to reduce high blood pressure (hypertension) in as little as 14 days, while also possibly providing other significant health benefits. These other health benefits include reducing the risk of breast cancer after menopause and reducing the risk of type 2 diabetes, heart disease, colon cancer, and stroke. Health benefits also include weight loss and slowing kidney failure in patients with chronic kidney disease.   DIET GUIDELINES  · Limit salt (sodium). Your diet should contain less than 1500 mg of sodium daily.  · Limit refined or processed carbohydrates. Your diet should include mostly whole grains. Desserts and added sugars should be used sparingly.  · Include small amounts of heart-healthy fats. These types of fats include nuts, oils, and tub margarine. Limit saturated and trans fats. These fats have been shown to be harmful in the body.  CHOOSING FOODS   The following food groups are based on a 2000 calorie diet. See your Registered Dietitian for individual calorie needs.  Grains and Grain Products (6 to 8 servings daily)  · Eat More Often: Whole-wheat bread, brown rice, whole-grain or wheat pasta, quinoa, popcorn without added fat or salt (air popped).  · Eat Less Often: White bread, white pasta, white rice, cornbread.  Vegetables (4 to 5 servings daily)  · Eat More Often: Fresh, frozen, and canned vegetables. Vegetables may be raw, steamed, roasted, or grilled with a minimal amount of fat.  · Eat Less Often/Avoid: Creamed or fried vegetables. Vegetables in a cheese sauce.  Fruit (4 to 5 servings daily)  · Eat More Often: All fresh, canned (in natural juice), or frozen fruits. Dried fruits without added sugar. One hundred percent fruit juice (½ cup [237 mL] daily).  · Eat Less Often: Dried fruits with added sugar. Canned fruit in light or heavy syrup.  Lean Meats, Fish, and Poultry (2  servings or less daily. One serving is 3 to 4 oz [85-114 g]).  · Eat More Often: Ninety percent or leaner ground beef, tenderloin, sirloin. Round cuts of beef, chicken breast, turkey breast. All fish. Grill, bake, or broil your meat. Nothing should be fried.  · Eat Less Often/Avoid: Fatty cuts of meat, turkey, or chicken leg, thigh, or wing. Fried cuts of meat or fish.  Dairy (2 to 3 servings)  · Eat More Often: Low-fat or fat-free milk, low-fat plain or light yogurt, reduced-fat or part-skim cheese.  · Eat Less Often/Avoid: Milk (whole, 2%). Whole milk yogurt. Full-fat cheeses.  Nuts, Seeds, and Legumes (4 to 5 servings per week)  · Eat More Often: All without added salt.  · Eat Less Often/Avoid: Salted nuts and seeds, canned beans with added salt.  Fats and Sweets (limited)  · Eat More Often: Vegetable oils, tub margarines without trans fats, sugar-free gelatin. Mayonnaise and salad dressings.  · Eat Less Often/Avoid: Coconut oils, palm oils, butter, stick margarine, cream, half and half, cookies, candy, pie.  FOR MORE INFORMATION  The Dash Diet Eating Plan: www.dashdiet.org  Document Released: 04/29/2011 Document Revised: 08/02/2011 Document Reviewed: 04/29/2011  [employer]® Patient Information ©2014 [employer].

## 2013-10-22 NOTE — Progress Notes (Signed)
Pre visit review using our clinic review tool, if applicable. No additional management support is needed unless otherwise documented below in the visit note. 

## 2013-10-28 ENCOUNTER — Encounter: Payer: Self-pay | Admitting: Family Medicine

## 2013-10-28 NOTE — Assessment & Plan Note (Signed)
Encouraged heart healthy diet, increase exercise, avoid trans fats, consider a krill oil cap daily 

## 2013-10-28 NOTE — Assessment & Plan Note (Signed)
Well controlled, no changes to meds. Encouraged heart healthy diet such as the DASH diet and exercise as tolerated.  °
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
## 2014-01-22 IMAGING — US US EXTREM LOW VENOUS*L*
1 series · 14 of 24 positions shown · non-contrast
Comparison: None.

CLINICAL DATA: 51-year-old male with lower extremity swelling.
History of left lower extremity DVT and ORIF.

LEFT LOWER EXTREMITY VENOUS DUPLEX ULTRASOUND
TECHNIQUE: Gray-scale sonography with graded compression, as well
as color Doppler and duplex ultrasound, were performed to evaluate
the deep venous system of the lower extremity from the level of the
common femoral vein through the popliteal and proximal calf veins.
Spectral Doppler was utilized to evaluate flow at rest and with
distal augmentation maneuvers.

[Series 1: us extrem low venous*left* · 14 of 25 slices shown]
[im 1/25]
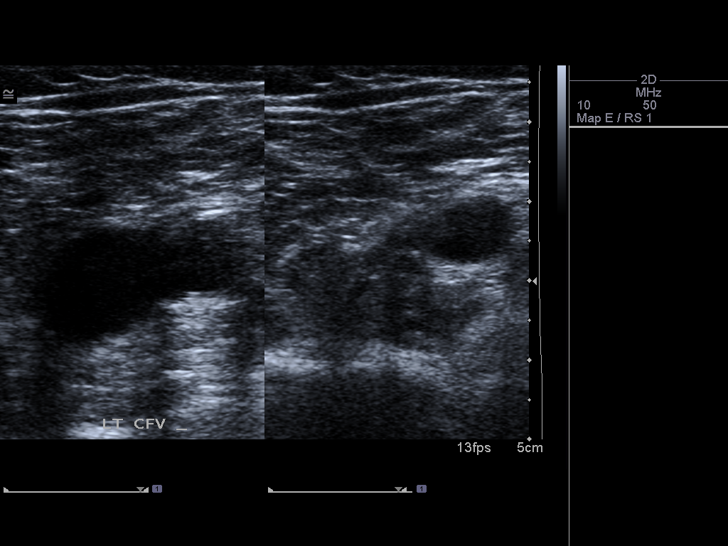
[im 3/25]
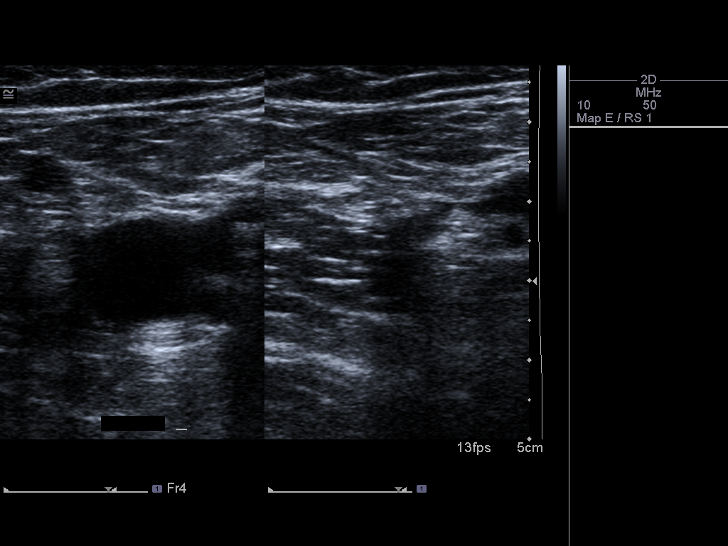
[im 5/25]
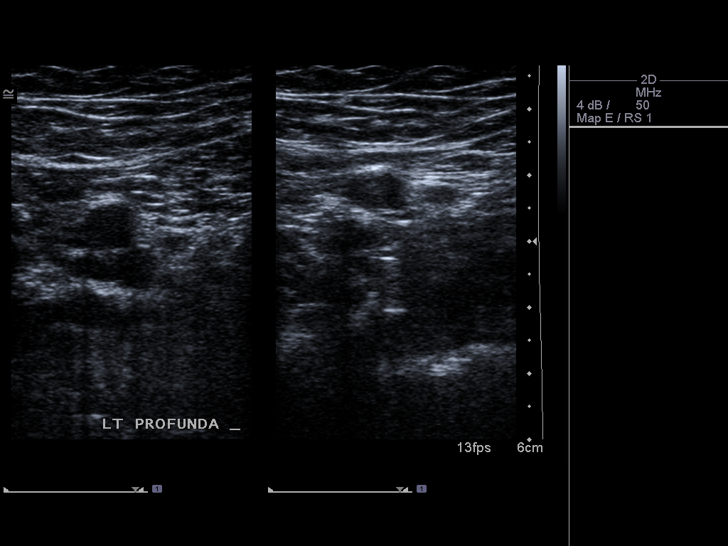
[im 7/25]
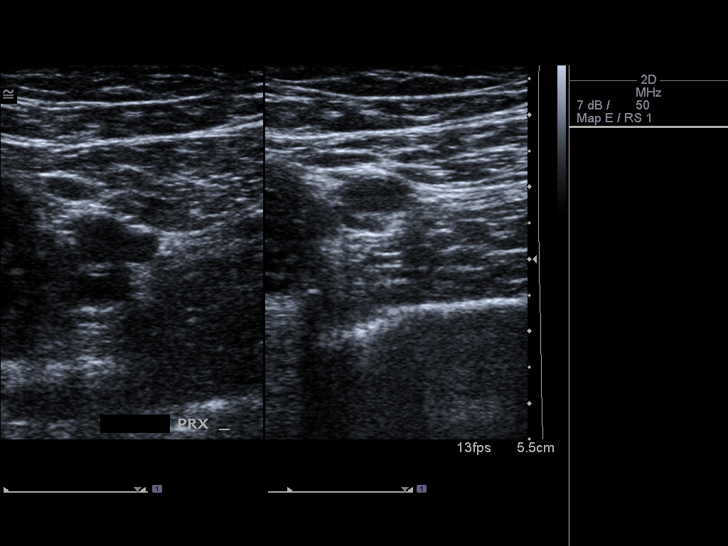
[im 8/25]
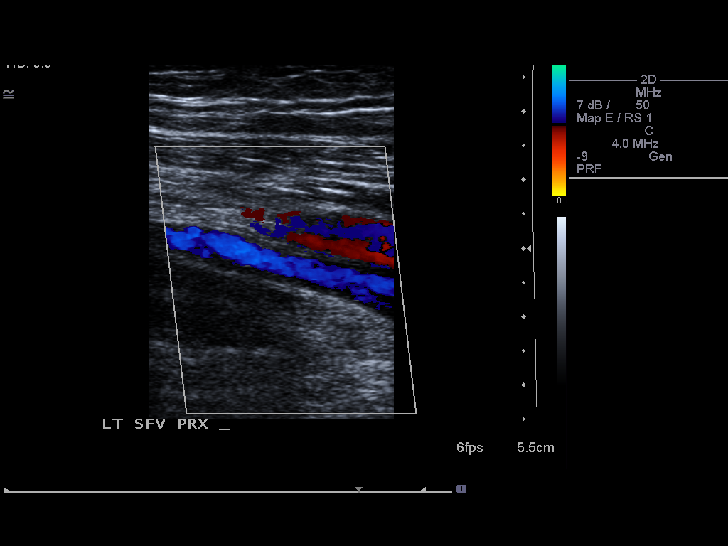
[im 10/25]
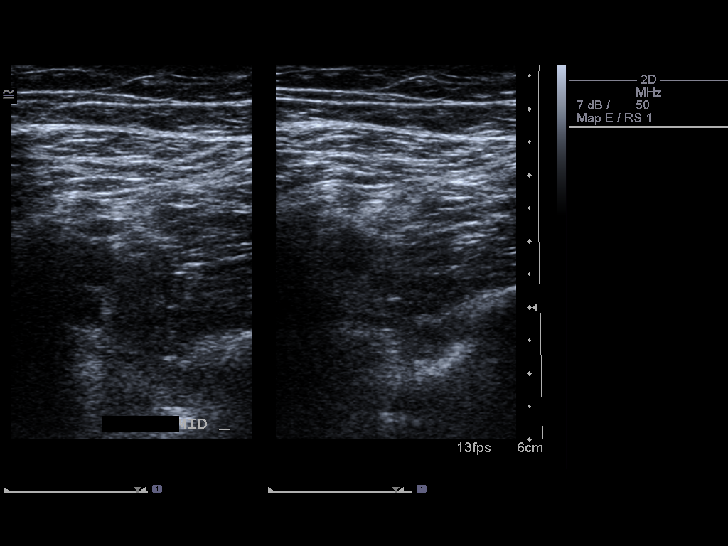
[im 12/25]
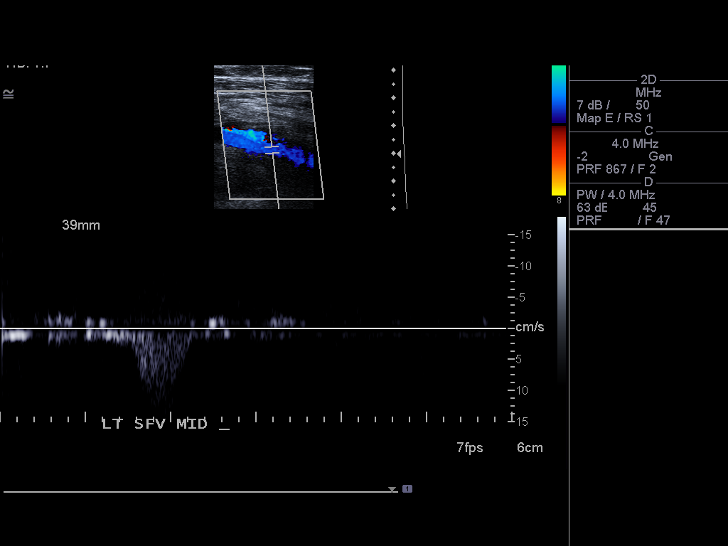
[im 13/25]
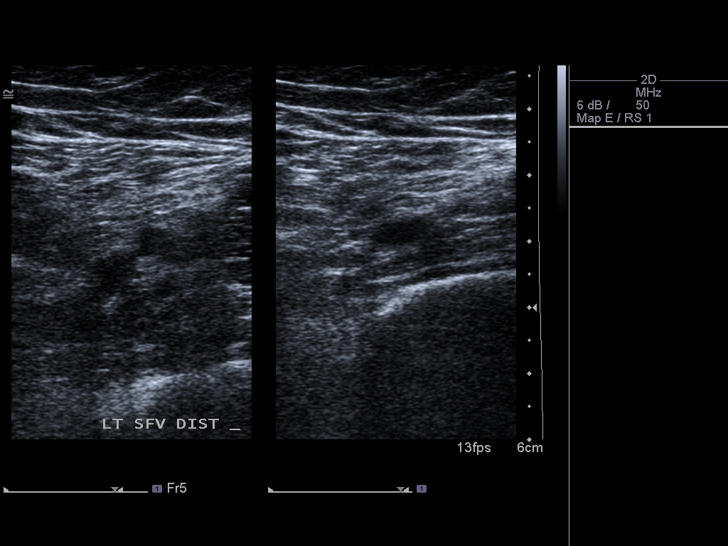
[im 15/25]
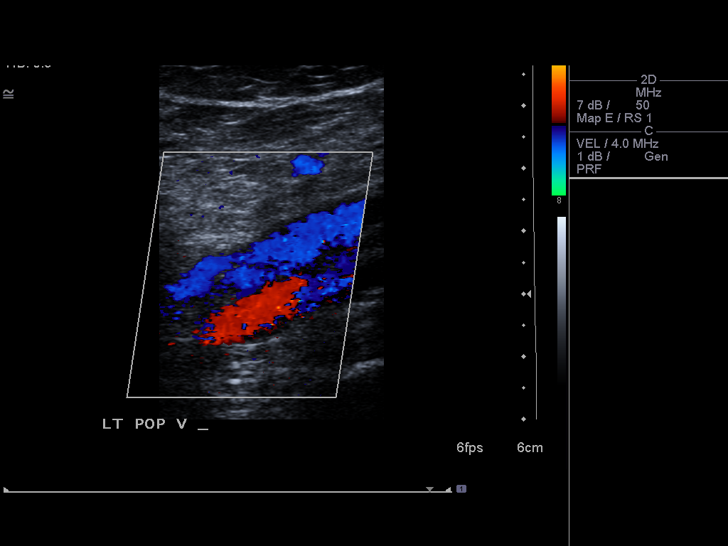
[im 17/25]
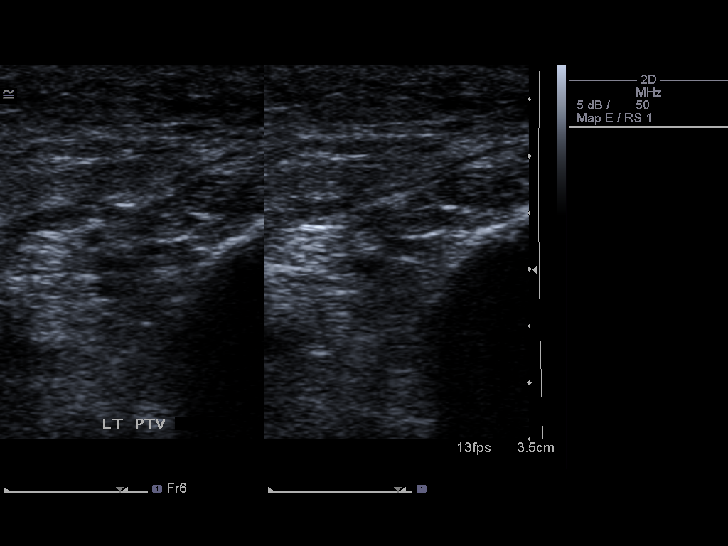
[im 19/25]
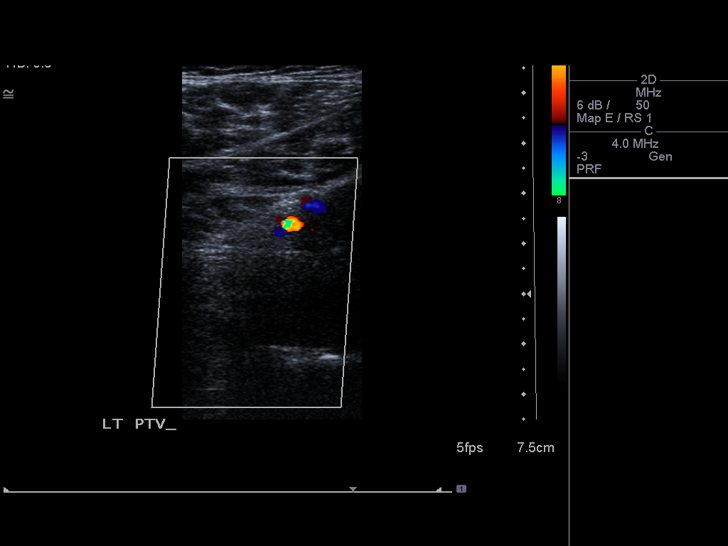
[im 20/25]
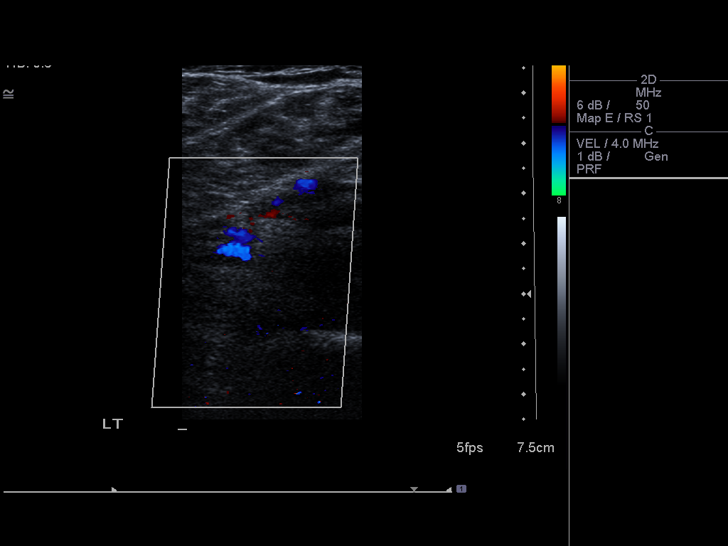
[im 22/25]
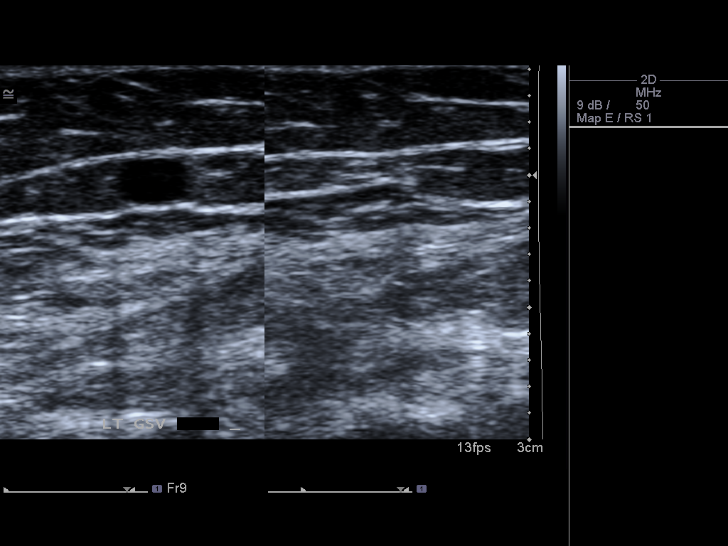
[im 25/25]
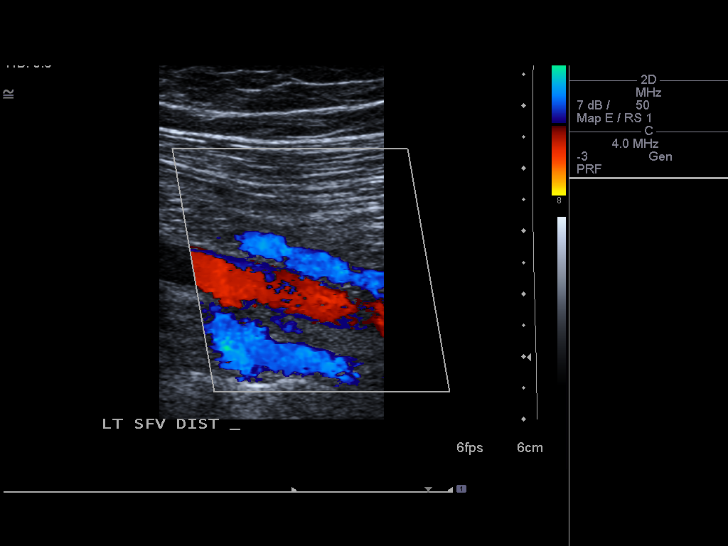

[14 of 24 positions shown; findings below may reference images not displayed]

FINDINGS: Normal compressibility of the left common femoral,
superficial femoral, and popliteal veins is demonstrated, as well
as the visualized proximal calf veins.  No filling defects to
suggest DVT on grayscale or color Doppler imaging.  Doppler
waveforms show normal direction of venous flow, normal respiratory
phasicity and response to augmentation.   Left greater saphenous
vein is normally compressible.
IMPRESSION: No evidence of left lower extremity deep venous thrombosis.

## 2014-04-15 ENCOUNTER — Other Ambulatory Visit: Payer: Self-pay | Admitting: Family Medicine

## 2014-04-29 ENCOUNTER — Ambulatory Visit (INDEPENDENT_AMBULATORY_CARE_PROVIDER_SITE_OTHER): Payer: BC Managed Care – PPO | Admitting: Family Medicine

## 2014-04-29 ENCOUNTER — Encounter: Payer: Self-pay | Admitting: Family Medicine

## 2014-04-29 VITALS — BP 134/87 | HR 62 | Temp 98.2°F | Ht 71.0 in | Wt 207.2 lb

## 2014-04-29 DIAGNOSIS — Z0000 Encounter for general adult medical examination without abnormal findings: Secondary | ICD-10-CM | POA: Diagnosis not present

## 2014-04-29 DIAGNOSIS — I10 Essential (primary) hypertension: Secondary | ICD-10-CM | POA: Diagnosis not present

## 2014-04-29 DIAGNOSIS — E785 Hyperlipidemia, unspecified: Secondary | ICD-10-CM | POA: Diagnosis not present

## 2014-04-29 LAB — HEPATIC FUNCTION PANEL
ALBUMIN: 4.3 g/dL (ref 3.5–5.2)
ALT: 14 U/L (ref 0–53)
AST: 22 U/L (ref 0–37)
Alkaline Phosphatase: 54 U/L (ref 39–117)
BILIRUBIN DIRECT: 0.1 mg/dL (ref 0.0–0.3)
TOTAL PROTEIN: 7.3 g/dL (ref 6.0–8.3)
Total Bilirubin: 0.9 mg/dL (ref 0.2–1.2)

## 2014-04-29 LAB — RENAL FUNCTION PANEL
ALBUMIN: 4.3 g/dL (ref 3.5–5.2)
BUN: 15 mg/dL (ref 6–23)
CALCIUM: 9.3 mg/dL (ref 8.4–10.5)
CO2: 29 meq/L (ref 19–32)
Chloride: 106 mEq/L (ref 96–112)
Creatinine, Ser: 1 mg/dL (ref 0.4–1.5)
GFR: 102.09 mL/min (ref >60.00)
GLUCOSE: 100 mg/dL — AB (ref 70–99)
Phosphorus: 2.8 mg/dL (ref 2.3–4.6)
Potassium: 4 mEq/L (ref 3.5–5.1)
Sodium: 141 mEq/L (ref 135–145)

## 2014-04-29 LAB — CBC
HCT: 42.2 % (ref 39.0–52.0)
Hemoglobin: 13.8 g/dL (ref 13.0–17.0)
MCHC: 32.7 g/dL (ref 30.0–36.0)
MCV: 87.9 fl (ref 78.0–100.0)
PLATELETS: 223 10*3/uL (ref 150.0–400.0)
RBC: 4.8 Mil/uL (ref 4.22–5.81)
RDW: 13.9 % (ref 11.5–15.5)
WBC: 4.1 10*3/uL (ref 4.0–10.5)

## 2014-04-29 LAB — LIPID PANEL
CHOL/HDL RATIO: 4
Cholesterol: 160 mg/dL (ref 0–200)
HDL: 41.6 mg/dL (ref >39.00)
LDL CALC: 104 mg/dL — AB (ref 0–99)
NonHDL: 118.4
TRIGLYCERIDES: 74 mg/dL (ref 0.0–149.0)
VLDL: 14.8 mg/dL (ref 0.0–40.0)

## 2014-04-29 LAB — PSA: PSA: 0.92 ng/mL (ref 0.10–4.00)

## 2014-04-29 LAB — TSH: TSH: 1.26 u[IU]/mL (ref 0.35–4.50)

## 2014-04-29 MED ORDER — AMLODIPINE BESYLATE 2.5 MG PO TABS: 2.5000 mg | ORAL_TABLET | Freq: Every day | ORAL | Status: DC

## 2014-04-29 NOTE — Progress Notes (Signed)
Pre visit review using our clinic review tool, if applicable. No additional management support is needed unless otherwise documented below in the visit note. 

## 2014-04-29 NOTE — Patient Instructions (Signed)
Preventive Care for Adults A healthy lifestyle and preventive care can promote health and wellness. Preventive health guidelines for men include the following key practices:  A routine yearly physical is a good way to check with your health care provider about your health and preventative screening. It is a chance to share any concerns and updates on your health and to receive a thorough exam.  Visit your dentist for a routine exam and preventative care every 6 months. Brush your teeth twice a day and floss once a day. Good oral hygiene prevents tooth decay and gum disease.  The frequency of eye exams is based on your age, health, family medical history, use of contact lenses, and other factors. Follow your health care provider's recommendations for frequency of eye exams.  Eat a healthy diet. Foods such as vegetables, fruits, whole grains, low-fat dairy products, and lean protein foods contain the nutrients you need without too many calories. Decrease your intake of foods high in solid fats, added sugars, and salt. Eat the right amount of calories for you.Get information about a proper diet from your health care provider, if necessary.  Regular physical exercise is one of the most important things you can do for your health. Most adults should get at least 150 minutes of moderate-intensity exercise (any activity that increases your heart rate and causes you to sweat) each week. In addition, most adults need muscle-strengthening exercises on 2 or more days a week.  Maintain a healthy weight. The body mass index (BMI) is a screening tool to identify possible weight problems. It provides an estimate of body fat based on height and weight. Your health care provider can find your BMI and can help you achieve or maintain a healthy weight.For adults 20 years and older:  A BMI below 18.5 is considered underweight.  A BMI of 18.5 to 24.9 is normal.  A BMI of 25 to 29.9 is considered overweight.  A BMI  of 30 and above is considered obese.  Maintain normal blood lipids and cholesterol levels by exercising and minimizing your intake of saturated fat. Eat a balanced diet with plenty of fruit and vegetables. Blood tests for lipids and cholesterol should begin at age 50 and be repeated every 5 years. If your lipid or cholesterol levels are high, you are over 50, or you are at high risk for heart disease, you may need your cholesterol levels checked more frequently.Ongoing high lipid and cholesterol levels should be treated with medicines if diet and exercise are not working.  If you smoke, find out from your health care provider how to quit. If you do not use tobacco, do not start.  Lung cancer screening is recommended for adults aged 73-80 years who are at high risk for developing lung cancer because of a history of smoking. A yearly low-dose CT scan of the lungs is recommended for people who have at least a 30-pack-year history of smoking and are a current smoker or have quit within the past 15 years. A pack year of smoking is smoking an average of 1 pack of cigarettes a day for 1 year (for example: 1 pack a day for 30 years or 2 packs a day for 15 years). Yearly screening should continue until the smoker has stopped smoking for at least 15 years. Yearly screening should be stopped for people who develop a health problem that would prevent them from having lung cancer treatment.  If you choose to drink alcohol, do not have more than  2 drinks per day. One drink is considered to be 12 ounces (355 mL) of beer, 5 ounces (148 mL) of wine, or 1.5 ounces (44 mL) of liquor.  Avoid use of street drugs. Do not share needles with anyone. Ask for help if you need support or instructions about stopping the use of drugs.  High blood pressure causes heart disease and increases the risk of stroke. Your blood pressure should be checked at least every 1-2 years. Ongoing high blood pressure should be treated with  medicines, if weight loss and exercise are not effective.  If you are 45-79 years old, ask your health care provider if you should take aspirin to prevent heart disease.  Diabetes screening involves taking a blood sample to check your fasting blood sugar level. This should be done once every 3 years, after age 45, if you are within normal weight and without risk factors for diabetes. Testing should be considered at a younger age or be carried out more frequently if you are overweight and have at least 1 risk factor for diabetes.  Colorectal cancer can be detected and often prevented. Most routine colorectal cancer screening begins at the age of 50 and continues through age 75. However, your health care provider may recommend screening at an earlier age if you have risk factors for colon cancer. On a yearly basis, your health care provider may provide home test kits to check for hidden blood in the stool. Use of a small camera at the end of a tube to directly examine the colon (sigmoidoscopy or colonoscopy) can detect the earliest forms of colorectal cancer. Talk to your health care provider about this at age 50, when routine screening begins. Direct exam of the colon should be repeated every 5-10 years through age 75, unless early forms of precancerous polyps or small growths are found.  People who are at an increased risk for hepatitis B should be screened for this virus. You are considered at high risk for hepatitis B if:  You were born in a country where hepatitis B occurs often. Talk with your health care provider about which countries are considered high risk.  Your parents were born in a high-risk country and you have not received a shot to protect against hepatitis B (hepatitis B vaccine).  You have HIV or AIDS.  You use needles to inject street drugs.  You live with, or have sex with, someone who has hepatitis B.  You are a man who has sex with other men (MSM).  You get hemodialysis  treatment.  You take certain medicines for conditions such as cancer, organ transplantation, and autoimmune conditions.  Hepatitis C blood testing is recommended for all people born from 1945 through 1965 and any individual with known risks for hepatitis C.  Practice safe sex. Use condoms and avoid high-risk sexual practices to reduce the spread of sexually transmitted infections (STIs). STIs include gonorrhea, chlamydia, syphilis, trichomonas, herpes, HPV, and human immunodeficiency virus (HIV). Herpes, HIV, and HPV are viral illnesses that have no cure. They can result in disability, cancer, and death.  If you are at risk of being infected with HIV, it is recommended that you take a prescription medicine daily to prevent HIV infection. This is called preexposure prophylaxis (PrEP). You are considered at risk if:  You are a man who has sex with other men (MSM) and have other risk factors.  You are a heterosexual man, are sexually active, and are at increased risk for HIV infection.    You take drugs by injection.  You are sexually active with a partner who has HIV.  Talk with your health care provider about whether you are at high risk of being infected with HIV. If you choose to begin PrEP, you should first be tested for HIV. You should then be tested every 3 months for as long as you are taking PrEP.  A one-time screening for abdominal aortic aneurysm (AAA) and surgical repair of large AAAs by ultrasound are recommended for men ages 32 to 67 years who are current or former smokers.  Healthy men should no longer receive prostate-specific antigen (PSA) blood tests as part of routine cancer screening. Talk with your health care provider about prostate cancer screening.  Testicular cancer screening is not recommended for adult males who have no symptoms. Screening includes self-exam, a health care provider exam, and other screening tests. Consult with your health care provider about any symptoms  you have or any concerns you have about testicular cancer.  Use sunscreen. Apply sunscreen liberally and repeatedly throughout the day. You should seek shade when your shadow is shorter than you. Protect yourself by wearing long sleeves, pants, a wide-brimmed hat, and sunglasses year round, whenever you are outdoors.  Once a month, do a whole-body skin exam, using a mirror to look at the skin on your back. Tell your health care provider about new moles, moles that have irregular borders, moles that are larger than a pencil eraser, or moles that have changed in shape or color.  Stay current with required vaccines (immunizations).  Influenza vaccine. All adults should be immunized every year.  Tetanus, diphtheria, and acellular pertussis (Td, Tdap) vaccine. An adult who has not previously received Tdap or who does not know his vaccine status should receive 1 dose of Tdap. This initial dose should be followed by tetanus and diphtheria toxoids (Td) booster doses every 10 years. Adults with an unknown or incomplete history of completing a 3-dose immunization series with Td-containing vaccines should begin or complete a primary immunization series including a Tdap dose. Adults should receive a Td booster every 10 years.  Varicella vaccine. An adult without evidence of immunity to varicella should receive 2 doses or a second dose if he has previously received 1 dose.  Human papillomavirus (HPV) vaccine. Males aged 68-21 years who have not received the vaccine previously should receive the 3-dose series. Males aged 22-26 years may be immunized. Immunization is recommended through the age of 6 years for any male who has sex with males and did not get any or all doses earlier. Immunization is recommended for any person with an immunocompromised condition through the age of 49 years if he did not get any or all doses earlier. During the 3-dose series, the second dose should be obtained 4-8 weeks after the first  dose. The third dose should be obtained 24 weeks after the first dose and 16 weeks after the second dose.  Zoster vaccine. One dose is recommended for adults aged 50 years or older unless certain conditions are present.  Measles, mumps, and rubella (MMR) vaccine. Adults born before [birth year] generally are considered immune to measles and mumps. Adults born in [birth year] or later should have 1 or more doses of MMR vaccine unless there is a contraindication to the vaccine or there is laboratory evidence of immunity to each of the three diseases. A routine second dose of MMR vaccine should be obtained at least 28 days after the first dose for students attending postsecondary  schools, health care workers, or international travelers. People who received inactivated measles vaccine or an unknown type of measles vaccine during 1963-1967 should receive 2 doses of MMR vaccine. People who received inactivated mumps vaccine or an unknown type of mumps vaccine before 1979 and are at high risk for mumps infection should consider immunization with 2 doses of MMR vaccine. Unvaccinated health care workers born before 1957 who lack laboratory evidence of measles, mumps, or rubella immunity or laboratory confirmation of disease should consider measles and mumps immunization with 2 doses of MMR vaccine or rubella immunization with 1 dose of MMR vaccine.  Pneumococcal 13-valent conjugate (PCV13) vaccine. When indicated, a person who is uncertain of his immunization history and has no record of immunization should receive the PCV13 vaccine. An adult aged 19 years or older who has certain medical conditions and has not been previously immunized should receive 1 dose of PCV13 vaccine. This PCV13 should be followed with a dose of pneumococcal polysaccharide (PPSV23) vaccine. The PPSV23 vaccine dose should be obtained at least 8 weeks after the dose of PCV13 vaccine. An adult aged 19 years or older who has certain medical conditions and  previously received 1 or more doses of PPSV23 vaccine should receive 1 dose of PCV13. The PCV13 vaccine dose should be obtained 1 or more years after the last PPSV23 vaccine dose.  Pneumococcal polysaccharide (PPSV23) vaccine. When PCV13 is also indicated, PCV13 should be obtained first. All adults aged 65 years and older should be immunized. An adult younger than age 65 years who has certain medical conditions should be immunized. Any person who resides in a nursing home or long-term care facility should be immunized. An adult smoker should be immunized. People with an immunocompromised condition and certain other conditions should receive both PCV13 and PPSV23 vaccines. People with human immunodeficiency virus (HIV) infection should be immunized as soon as possible after diagnosis. Immunization during chemotherapy or radiation therapy should be avoided. Routine use of PPSV23 vaccine is not recommended for American Indians, [place] Natives, or people younger than 65 years unless there are medical conditions that require PPSV23 vaccine. When indicated, people who have unknown immunization and have no record of immunization should receive PPSV23 vaccine. One-time revaccination 5 years after the first dose of PPSV23 is recommended for people aged 19-64 years who have chronic kidney failure, nephrotic syndrome, asplenia, or immunocompromised conditions. People who received 1-2 doses of PPSV23 before age 65 years should receive another dose of PPSV23 vaccine at age 65 years or later if at least 5 years have passed since the previous dose. Doses of PPSV23 are not needed for people immunized with PPSV23 at or after age 65 years.  Meningococcal vaccine. Adults with asplenia or persistent complement component deficiencies should receive 2 doses of quadrivalent meningococcal conjugate (MenACWY-D) vaccine. The doses should be obtained at least 2 months apart. Microbiologists working with certain meningococcal bacteria,  military recruits, people at risk during an outbreak, and people who travel to or live in countries with a high rate of meningitis should be immunized. A first-year college student up through age 21 years who is living in a residence hall should receive a dose if he did not receive a dose on or after his 16th birthday. Adults who have certain high-risk conditions should receive one or more doses of vaccine.  Hepatitis A vaccine. Adults who wish to be protected from this disease, have certain high-risk conditions, work with hepatitis A-infected animals, work in hepatitis A research labs, or   travel to or work in countries with a high rate of hepatitis A should be immunized. Adults who were previously unvaccinated and who anticipate close contact with an international adoptee during the first 60 days after arrival in the [place] from a country with a high rate of hepatitis A should be immunized.  Hepatitis B vaccine. Adults should be immunized if they wish to be protected from this disease, have certain high-risk conditions, may be exposed to blood or other infectious body fluids, are household contacts or sex partners of hepatitis B positive people, are clients or workers in certain care facilities, or travel to or work in countries with a high rate of hepatitis B.  Haemophilus influenzae type b (Hib) vaccine. A previously unvaccinated person with asplenia or sickle cell disease or having a scheduled splenectomy should receive 1 dose of Hib vaccine. Regardless of previous immunization, a recipient of a hematopoietic stem cell transplant should receive a 3-dose series 6-12 months after his successful transplant. Hib vaccine is not recommended for adults with HIV infection. Preventive Service / Frequency Ages 52 to 17  Blood pressure check.** / Every 1 to 2 years.  Lipid and cholesterol check.** / Every 5 years beginning at age 69.  Hepatitis C blood test.** / For any individual with known risks for  hepatitis C.  Skin self-exam. / Monthly.  Influenza vaccine. / Every year.  Tetanus, diphtheria, and acellular pertussis (Tdap, Td) vaccine.** / Consult your health care provider. 1 dose of Td every 10 years.  Varicella vaccine.** / Consult your health care provider.  HPV vaccine. / 3 doses over 6 months, if 72 or younger.  Measles, mumps, rubella (MMR) vaccine.** / You need at least 1 dose of MMR if you were born in 1957 or later. You may also need a second dose.  Pneumococcal 13-valent conjugate (PCV13) vaccine.** / Consult your health care provider.  Pneumococcal polysaccharide (PPSV23) vaccine.** / 1 to 2 doses if you smoke cigarettes or if you have certain conditions.  Meningococcal vaccine.** / 1 dose if you are age 35 to 60 years and a Market researcher living in a residence hall, or have one of several medical conditions. You may also need additional booster doses.  Hepatitis A vaccine.** / Consult your health care provider.  Hepatitis B vaccine.** / Consult your health care provider.  Haemophilus influenzae type b (Hib) vaccine.** / Consult your health care provider. Ages 35 to 8  Blood pressure check.** / Every 1 to 2 years.  Lipid and cholesterol check.** / Every 5 years beginning at age 57.  Lung cancer screening. / Every year if you are aged 44-80 years and have a 30-pack-year history of smoking and currently smoke or have quit within the past 15 years. Yearly screening is stopped once you have quit smoking for at least 15 years or develop a health problem that would prevent you from having lung cancer treatment.  Fecal occult blood test (FOBT) of stool. / Every year beginning at age 55 and continuing until age 73. You may not have to do this test if you get a colonoscopy every 10 years.  Flexible sigmoidoscopy** or colonoscopy.** / Every 5 years for a flexible sigmoidoscopy or every 10 years for a colonoscopy beginning at age 28 and continuing until age  1.  Hepatitis C blood test.** / For all people born from [birth year] through 1965 and any individual with known risks for hepatitis C.  Skin self-exam. / Monthly.  Influenza vaccine. / Every  year.  Tetanus, diphtheria, and acellular pertussis (Tdap/Td) vaccine.** / Consult your health care provider. 1 dose of Td every 10 years.  Varicella vaccine.** / Consult your health care provider.  Zoster vaccine.** / 1 dose for adults aged 53 years or older.  Measles, mumps, rubella (MMR) vaccine.** / You need at least 1 dose of MMR if you were born in 1957 or later. You may also need a second dose.  Pneumococcal 13-valent conjugate (PCV13) vaccine.** / Consult your health care provider.  Pneumococcal polysaccharide (PPSV23) vaccine.** / 1 to 2 doses if you smoke cigarettes or if you have certain conditions.  Meningococcal vaccine.** / Consult your health care provider.  Hepatitis A vaccine.** / Consult your health care provider.  Hepatitis B vaccine.** / Consult your health care provider.  Haemophilus influenzae type b (Hib) vaccine.** / Consult your health care provider. Ages 77 and over  Blood pressure check.** / Every 1 to 2 years.  Lipid and cholesterol check.**/ Every 5 years beginning at age 85.  Lung cancer screening. / Every year if you are aged 55-80 years and have a 30-pack-year history of smoking and currently smoke or have quit within the past 15 years. Yearly screening is stopped once you have quit smoking for at least 15 years or develop a health problem that would prevent you from having lung cancer treatment.  Fecal occult blood test (FOBT) of stool. / Every year beginning at age 33 and continuing until age 11. You may not have to do this test if you get a colonoscopy every 10 years.  Flexible sigmoidoscopy** or colonoscopy.** / Every 5 years for a flexible sigmoidoscopy or every 10 years for a colonoscopy beginning at age 28 and continuing until age 73.  Hepatitis C blood  test.** / For all people born from [birth year] through 1965 and any individual with known risks for hepatitis C.  Abdominal aortic aneurysm (AAA) screening.** / A one-time screening for ages 50 to 27 years who are current or former smokers.  Skin self-exam. / Monthly.  Influenza vaccine. / Every year.  Tetanus, diphtheria, and acellular pertussis (Tdap/Td) vaccine.** / 1 dose of Td every 10 years.  Varicella vaccine.** / Consult your health care provider.  Zoster vaccine.** / 1 dose for adults aged 34 years or older.  Pneumococcal 13-valent conjugate (PCV13) vaccine.** / Consult your health care provider.  Pneumococcal polysaccharide (PPSV23) vaccine.** / 1 dose for all adults aged 63 years and older.  Meningococcal vaccine.** / Consult your health care provider.  Hepatitis A vaccine.** / Consult your health care provider.  Hepatitis B vaccine.** / Consult your health care provider.  Haemophilus influenzae type b (Hib) vaccine.** / Consult your health care provider. **Family history and personal history of risk and conditions may change your health care provider's recommendations. Document Released: 07/06/2001 Document Revised: 05/15/2013 Document Reviewed: 10/05/2010 [facility] Patient Information 2015 [place], [place]. This information is not intended to replace advice given to you by your health care provider. Make sure you discuss any questions you have with your health care provider.
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
## 2014-05-05 ENCOUNTER — Encounter: Payer: Self-pay | Admitting: Family Medicine

## 2014-05-05 NOTE — Assessment & Plan Note (Signed)
Patient encouraged to maintain heart healthy diet, regular exercise, adequate sleep. Consider daily probiotics. Take medications as prescribed.  Annual labs dr3awn

## 2014-05-05 NOTE — Assessment & Plan Note (Signed)
Encouraged heart healthy diet, increase exercise, avoid trans fats, consider a krill oil cap daily 

## 2014-05-05 NOTE — Assessment & Plan Note (Signed)
Well controlled, no changes to meds. Encouraged heart healthy diet such as the DASH diet and exercise as tolerated.  °

## 2014-09-30 ENCOUNTER — Encounter: Payer: Self-pay | Admitting: Family Medicine

## 2014-09-30 ENCOUNTER — Ambulatory Visit (INDEPENDENT_AMBULATORY_CARE_PROVIDER_SITE_OTHER): Payer: BLUE CROSS/BLUE SHIELD | Admitting: Family Medicine

## 2014-09-30 ENCOUNTER — Ambulatory Visit: Payer: BC Managed Care – PPO | Admitting: Family Medicine

## 2014-09-30 VITALS — BP 132/88 | HR 75 | Temp 98.1°F | Ht 70.5 in | Wt 199.4 lb

## 2014-09-30 DIAGNOSIS — H542 Low vision, both eyes: Secondary | ICD-10-CM

## 2014-09-30 DIAGNOSIS — E785 Hyperlipidemia, unspecified: Secondary | ICD-10-CM

## 2014-09-30 DIAGNOSIS — I10 Essential (primary) hypertension: Secondary | ICD-10-CM | POA: Diagnosis not present

## 2014-09-30 DIAGNOSIS — T7840XA Allergy, unspecified, initial encounter: Secondary | ICD-10-CM

## 2014-09-30 DIAGNOSIS — H543 Unqualified visual loss, both eyes: Secondary | ICD-10-CM | POA: Insufficient documentation

## 2014-09-30 DIAGNOSIS — G4733 Obstructive sleep apnea (adult) (pediatric): Secondary | ICD-10-CM

## 2014-09-30 HISTORY — DX: Allergy, unspecified, initial encounter: T78.40XA

## 2014-09-30 MED ORDER — FLUTICASONE PROPIONATE 50 MCG/ACT NA SUSP: 2.0000 | Freq: Every day | NASAL | Status: DC

## 2014-09-30 MED ORDER — AMLODIPINE BESYLATE 2.5 MG PO TABS: 2.5000 mg | ORAL_TABLET | Freq: Every day | ORAL | Status: DC

## 2014-09-30 NOTE — Assessment & Plan Note (Signed)
Well controlled, no changes to meds. Encouraged heart healthy diet such as the DASH diet and exercise as tolerated.  °

## 2014-09-30 NOTE — Assessment & Plan Note (Signed)
Encouraged heart healthy diet, is exercising regularly and has lost nearly 10 pounds

## 2014-09-30 NOTE — Patient Instructions (Signed)

## 2014-09-30 NOTE — Assessment & Plan Note (Signed)
Mild, untreated, asymptomatic, patient advised regarding symptoms of worsening disease would consider repeat study if symptoms develop

## 2014-09-30 NOTE — Assessment & Plan Note (Signed)
Patient reports watery but not itchy eyes. ? Allergies affecting acuity? Will start Flonase and reassess
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
## 2014-09-30 NOTE — Progress Notes (Signed)
Pre visit review using our clinic review tool, if applicable. No additional management support is needed unless otherwise documented below in the visit note. 

## 2014-10-03 ENCOUNTER — Encounter: Payer: Self-pay | Admitting: Family Medicine

## 2014-10-07 ENCOUNTER — Ambulatory Visit: Payer: Self-pay | Admitting: Family Medicine

## 2014-10-13 ENCOUNTER — Encounter: Payer: Self-pay | Admitting: Family Medicine

## 2015-05-05 ENCOUNTER — Encounter: Payer: Self-pay | Admitting: Family Medicine

## 2015-05-16 ENCOUNTER — Other Ambulatory Visit: Payer: Self-pay | Admitting: *Deleted

## 2015-05-16 MED ORDER — AMLODIPINE BESYLATE 2.5 MG PO TABS: 2.5000 mg | ORAL_TABLET | Freq: Every day | ORAL | Status: DC

## 2015-05-16 NOTE — Progress Notes (Signed)
Per requested fax from pharmacy, Rx sent via electronically/SLS

## 2015-11-10 ENCOUNTER — Other Ambulatory Visit: Payer: Self-pay

## 2015-11-10 MED ORDER — AMLODIPINE BESYLATE 2.5 MG PO TABS: 2.5000 mg | ORAL_TABLET | Freq: Every day | ORAL | Status: DC

## 2016-01-12 ENCOUNTER — Encounter: Payer: Self-pay | Admitting: Family Medicine

## 2016-01-12 ENCOUNTER — Ambulatory Visit: Payer: BLUE CROSS/BLUE SHIELD | Admitting: Family Medicine

## 2016-01-12 ENCOUNTER — Ambulatory Visit (INDEPENDENT_AMBULATORY_CARE_PROVIDER_SITE_OTHER): Payer: BLUE CROSS/BLUE SHIELD | Admitting: Family Medicine

## 2016-01-12 VITALS — BP 108/68 | HR 59 | Temp 97.9°F | Ht 71.0 in | Wt 178.2 lb

## 2016-01-12 DIAGNOSIS — M25579 Pain in unspecified ankle and joints of unspecified foot: Secondary | ICD-10-CM

## 2016-01-12 DIAGNOSIS — E782 Mixed hyperlipidemia: Secondary | ICD-10-CM | POA: Diagnosis not present

## 2016-01-12 DIAGNOSIS — I10 Essential (primary) hypertension: Secondary | ICD-10-CM | POA: Diagnosis not present

## 2016-01-12 NOTE — Patient Instructions (Signed)
Hypertension Hypertension, commonly called high blood pressure, is when the force of blood pumping through your arteries is too strong. Your arteries are the blood vessels that carry blood from your heart throughout your body. A blood pressure reading consists of a higher number over a lower number, such as 110/72. The higher number (systolic) is the pressure inside your arteries when your heart pumps. The lower number (diastolic) is the pressure inside your arteries when your heart relaxes. Ideally you want your blood pressure below 120/80. Hypertension forces your heart to work harder to pump blood. Your arteries may become narrow or stiff. Having untreated or uncontrolled hypertension can cause heart attack, stroke, kidney disease, and other problems. RISK FACTORS Some risk factors for high blood pressure are controllable. Others are not.  Risk factors you cannot control include:   Race. You may be at higher risk if you are African American.  Age. Risk increases with age.  Gender. Men are at higher risk than women before age 45 years. After age 65, women are at higher risk than men. Risk factors you can control include:  Not getting enough exercise or physical activity.  Being overweight.  Getting too much fat, sugar, calories, or salt in your diet.  Drinking too much alcohol. SIGNS AND SYMPTOMS Hypertension does not usually cause signs or symptoms. Extremely high blood pressure (hypertensive crisis) may cause headache, anxiety, shortness of breath, and nosebleed. DIAGNOSIS To check if you have hypertension, your health care provider will measure your blood pressure while you are seated, with your arm held at the level of your heart. It should be measured at least twice using the same arm. Certain conditions can cause a difference in blood pressure between your right and left arms. A blood pressure reading that is higher than normal on one occasion does not mean that you need treatment. If  it is not clear whether you have high blood pressure, you may be asked to return on a different day to have your blood pressure checked again. Or, you may be asked to monitor your blood pressure at home for 1 or more weeks. TREATMENT Treating high blood pressure includes making lifestyle changes and possibly taking medicine. Living a healthy lifestyle can help lower high blood pressure. You may need to change some of your habits. Lifestyle changes may include:  Following the DASH diet. This diet is high in fruits, vegetables, and whole grains. It is low in salt, red meat, and added sugars.  Keep your sodium intake below 2,300 mg per day.  Getting at least 30-45 minutes of aerobic exercise at least 4 times per week.  Losing weight if necessary.  Not smoking.  Limiting alcoholic beverages.  Learning ways to reduce stress. Your health care provider may prescribe medicine if lifestyle changes are not enough to get your blood pressure under control, and if one of the following is true:  You are 18-59 years of age and your systolic blood pressure is above 140.  You are 60 years of age or older, and your systolic blood pressure is above 150.  Your diastolic blood pressure is above 90.  You have diabetes, and your systolic blood pressure is over 140 or your diastolic blood pressure is over 90.  You have kidney disease and your blood pressure is above 140/90.  You have heart disease and your blood pressure is above 140/90. Your personal target blood pressure may vary depending on your medical conditions, your age, and other factors. HOME CARE INSTRUCTIONS    Have your blood pressure rechecked as directed by your health care provider.   Take medicines only as directed by your health care provider. Follow the directions carefully. Blood pressure medicines must be taken as prescribed. The medicine does not work as well when you skip doses. Skipping doses also puts you at risk for  problems.  Do not smoke.   Monitor your blood pressure at home as directed by your health care provider. SEEK MEDICAL CARE IF:   You think you are having a reaction to medicines taken.  You have recurrent headaches or feel dizzy.  You have swelling in your ankles.  You have trouble with your vision. SEEK IMMEDIATE MEDICAL CARE IF:  You develop a severe headache or confusion.  You have unusual weakness, numbness, or feel faint.  You have severe chest or abdominal pain.  You vomit repeatedly.  You have trouble breathing. MAKE SURE YOU:   Understand these instructions.  Will watch your condition.  Will get help right away if you are not doing well or get worse.   This information is not intended to replace advice given to you by your health care provider. Make sure you discuss any questions you have with your health care provider.   Document Released: 05/10/2005 Document Revised: 09/24/2014 Document Reviewed: 03/02/2013 Elsevier Interactive Patient Education 2016 [employer]  

## 2016-01-12 NOTE — Progress Notes (Signed)
Pre visit review using our clinic review tool, if applicable. No additional management support is needed unless otherwise documented below in the visit note. 

## 2016-01-21 ENCOUNTER — Encounter: Payer: Self-pay | Admitting: Family Medicine

## 2016-01-21 DIAGNOSIS — M25579 Pain in unspecified ankle and joints of unspecified foot: Secondary | ICD-10-CM

## 2016-01-21 DIAGNOSIS — E782 Mixed hyperlipidemia: Secondary | ICD-10-CM | POA: Insufficient documentation

## 2016-01-21 HISTORY — DX: Pain in unspecified ankle and joints of unspecified foot: M25.579

## 2016-01-21 NOTE — Assessment & Plan Note (Signed)
Encouraged heart healthy diet, increase exercise, avoid trans fats, consider a krill oil cap daily 

## 2016-01-21 NOTE — Assessment & Plan Note (Signed)
Using shoe inserts and this is helping. Encouraged stretching, good shoes, topical pain meds and ice.
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
## 2016-01-21 NOTE — Assessment & Plan Note (Signed)
Well controlled, no changes to meds. Encouraged heart healthy diet such as the DASH diet and exercise as tolerated.  °

## 2016-02-02 ENCOUNTER — Other Ambulatory Visit (INDEPENDENT_AMBULATORY_CARE_PROVIDER_SITE_OTHER): Payer: BLUE CROSS/BLUE SHIELD

## 2016-02-02 ENCOUNTER — Other Ambulatory Visit: Payer: BLUE CROSS/BLUE SHIELD

## 2016-02-02 DIAGNOSIS — E782 Mixed hyperlipidemia: Secondary | ICD-10-CM

## 2016-02-02 DIAGNOSIS — I10 Essential (primary) hypertension: Secondary | ICD-10-CM

## 2016-02-02 LAB — COMPREHENSIVE METABOLIC PANEL
ALBUMIN: 4.4 g/dL (ref 3.5–5.2)
ALT: 11 U/L (ref 0–53)
AST: 17 U/L (ref 0–37)
Alkaline Phosphatase: 51 U/L (ref 39–117)
BUN: 15 mg/dL (ref 6–23)
CHLORIDE: 104 meq/L (ref 96–112)
CO2: 29 mEq/L (ref 19–32)
Calcium: 9.3 mg/dL (ref 8.4–10.5)
Creatinine, Ser: 1 mg/dL (ref 0.40–1.50)
GFR: 99.1 mL/min (ref >60.00)
GLUCOSE: 77 mg/dL (ref 70–99)
POTASSIUM: 3.7 meq/L (ref 3.5–5.1)
SODIUM: 140 meq/L (ref 135–145)
Total Bilirubin: 0.6 mg/dL (ref 0.2–1.2)
Total Protein: 7.4 g/dL (ref 6.0–8.3)

## 2016-02-02 LAB — LIPID PANEL
Cholesterol: 156 mg/dL (ref 0–200)
HDL: 51.2 mg/dL (ref >39.00)
LDL CALC: 88 mg/dL (ref 0–99)
NONHDL: 104.84
Total CHOL/HDL Ratio: 3
Triglycerides: 86 mg/dL (ref 0.0–149.0)
VLDL: 17.2 mg/dL (ref 0.0–40.0)

## 2016-02-02 LAB — CBC
HEMATOCRIT: 40.6 % (ref 39.0–52.0)
HEMOGLOBIN: 13.8 g/dL (ref 13.0–17.0)
MCHC: 33.9 g/dL (ref 30.0–36.0)
MCV: 88.9 fl (ref 78.0–100.0)
PLATELETS: 207 10*3/uL (ref 150.0–400.0)
RBC: 4.57 Mil/uL (ref 4.22–5.81)
RDW: 14 % (ref 11.5–15.5)
WBC: 4.9 10*3/uL (ref 4.0–10.5)

## 2016-02-02 LAB — TSH: TSH: 1.67 u[IU]/mL (ref 0.35–4.50)

## 2016-04-25 ENCOUNTER — Other Ambulatory Visit: Payer: Self-pay | Admitting: Family Medicine

## 2016-07-12 ENCOUNTER — Ambulatory Visit: Payer: BLUE CROSS/BLUE SHIELD | Admitting: Family Medicine

## 2016-07-19 ENCOUNTER — Ambulatory Visit: Payer: BLUE CROSS/BLUE SHIELD | Admitting: Family Medicine

## 2016-08-13 DIAGNOSIS — J069 Acute upper respiratory infection, unspecified: Secondary | ICD-10-CM | POA: Diagnosis not present

## 2016-09-06 ENCOUNTER — Ambulatory Visit (INDEPENDENT_AMBULATORY_CARE_PROVIDER_SITE_OTHER): Payer: BLUE CROSS/BLUE SHIELD | Admitting: Family Medicine

## 2016-09-06 ENCOUNTER — Encounter: Payer: Self-pay | Admitting: Family Medicine

## 2016-09-06 VITALS — BP 132/68 | HR 55 | Temp 98.3°F | Resp 18 | Wt 176.2 lb

## 2016-09-06 DIAGNOSIS — I10 Essential (primary) hypertension: Secondary | ICD-10-CM

## 2016-09-06 DIAGNOSIS — E782 Mixed hyperlipidemia: Secondary | ICD-10-CM

## 2016-09-06 DIAGNOSIS — Z7289 Other problems related to lifestyle: Secondary | ICD-10-CM

## 2016-09-06 NOTE — Patient Instructions (Signed)

## 2016-09-06 NOTE — Progress Notes (Signed)
Pre visit review using our clinic review tool, if applicable. No additional management support is needed unless otherwise documented below in the visit note. 
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
## 2016-09-06 NOTE — Assessment & Plan Note (Signed)
Encouraged heart healthy diet, increase exercise, avoid trans fats, consider a krill oil cap daily 

## 2016-09-06 NOTE — Assessment & Plan Note (Signed)
Well controlled, no changes to meds. Encouraged heart healthy diet such as the DASH diet and exercise as tolerated.  °

## 2016-09-06 NOTE — Assessment & Plan Note (Signed)
Did not hit the criteria for treatment and has since lost weight so sleeps better and snores less now.

## 2016-11-19 ENCOUNTER — Other Ambulatory Visit: Payer: Self-pay | Admitting: Family Medicine

## 2017-02-16 ENCOUNTER — Other Ambulatory Visit: Payer: Self-pay | Admitting: Family Medicine

## 2017-02-22 ENCOUNTER — Other Ambulatory Visit (INDEPENDENT_AMBULATORY_CARE_PROVIDER_SITE_OTHER): Payer: BLUE CROSS/BLUE SHIELD

## 2017-02-22 DIAGNOSIS — E782 Mixed hyperlipidemia: Secondary | ICD-10-CM

## 2017-02-22 DIAGNOSIS — I10 Essential (primary) hypertension: Secondary | ICD-10-CM | POA: Diagnosis not present

## 2017-02-22 DIAGNOSIS — Z7289 Other problems related to lifestyle: Secondary | ICD-10-CM

## 2017-02-22 LAB — COMPREHENSIVE METABOLIC PANEL
ALT: 14 U/L (ref 0–53)
AST: 25 U/L (ref 0–37)
Albumin: 4 g/dL (ref 3.5–5.2)
Alkaline Phosphatase: 37 U/L — ABNORMAL LOW (ref 39–117)
BILIRUBIN TOTAL: 0.4 mg/dL (ref 0.2–1.2)
BUN: 13 mg/dL (ref 6–23)
CALCIUM: 9.1 mg/dL (ref 8.4–10.5)
CO2: 29 mEq/L (ref 19–32)
CREATININE: 1.08 mg/dL (ref 0.40–1.50)
Chloride: 105 mEq/L (ref 96–112)
GFR: 90.34 mL/min (ref >60.00)
GLUCOSE: 90 mg/dL (ref 70–99)
Potassium: 3.7 mEq/L (ref 3.5–5.1)
SODIUM: 138 meq/L (ref 135–145)
Total Protein: 7 g/dL (ref 6.0–8.3)

## 2017-02-22 LAB — CBC
HCT: 40.7 % (ref 39.0–52.0)
Hemoglobin: 14 g/dL (ref 13.0–17.0)
MCHC: 34.3 g/dL (ref 30.0–36.0)
MCV: 89.5 fl (ref 78.0–100.0)
PLATELETS: 201 10*3/uL (ref 150.0–400.0)
RBC: 4.55 Mil/uL (ref 4.22–5.81)
RDW: 13.3 % (ref 11.5–15.5)
WBC: 3.2 10*3/uL — ABNORMAL LOW (ref 4.0–10.5)

## 2017-02-22 LAB — LIPID PANEL
CHOL/HDL RATIO: 3
Cholesterol: 110 mg/dL (ref 0–200)
HDL: 37.6 mg/dL — ABNORMAL LOW (ref >39.00)
LDL Cholesterol: 62 mg/dL (ref 0–99)
NONHDL: 72.69
TRIGLYCERIDES: 54 mg/dL (ref 0.0–149.0)
VLDL: 10.8 mg/dL (ref 0.0–40.0)

## 2017-02-22 LAB — TSH: TSH: 1.69 u[IU]/mL (ref 0.35–4.50)

## 2017-02-23 LAB — HEPATITIS C ANTIBODY
Hepatitis C Ab: NONREACTIVE
SIGNAL TO CUT-OFF: 0.03 (ref <1.00)

## 2017-03-14 ENCOUNTER — Encounter: Payer: Self-pay | Admitting: Family Medicine

## 2017-03-14 ENCOUNTER — Ambulatory Visit (INDEPENDENT_AMBULATORY_CARE_PROVIDER_SITE_OTHER): Payer: BLUE CROSS/BLUE SHIELD | Admitting: Family Medicine

## 2017-03-14 VITALS — BP 118/78 | HR 66 | Temp 98.0°F | Resp 18 | Ht 71.0 in | Wt 182.2 lb

## 2017-03-14 DIAGNOSIS — D72819 Decreased white blood cell count, unspecified: Secondary | ICD-10-CM

## 2017-03-14 DIAGNOSIS — Z0000 Encounter for general adult medical examination without abnormal findings: Secondary | ICD-10-CM

## 2017-03-14 DIAGNOSIS — I10 Essential (primary) hypertension: Secondary | ICD-10-CM

## 2017-03-14 DIAGNOSIS — E782 Mixed hyperlipidemia: Secondary | ICD-10-CM | POA: Diagnosis not present

## 2017-03-14 NOTE — Assessment & Plan Note (Signed)
Encouraged heart healthy diet, increase exercise, avoid trans fats, consider a krill oil cap daily 

## 2017-03-14 NOTE — Assessment & Plan Note (Signed)
Patient encouraged to maintain heart healthy diet, regular exercise, adequate sleep. Consider daily probiotics. Take medications as prescribed 
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
## 2017-03-14 NOTE — Assessment & Plan Note (Signed)
Had experienced a diarrheal illness prior to his blood draw. This will likely resolve. Will recheck cbc with diff with an HIV test in 1 month

## 2017-03-14 NOTE — Patient Instructions (Signed)
Preventive Care 40-64 Years, Male Preventive care refers to lifestyle choices and visits with your health care provider that can promote health and wellness. What does preventive care include?  A yearly physical exam. This is also called an annual well check.  Dental exams once or twice a year.  Routine eye exams. Ask your health care provider how often you should have your eyes checked.  Personal lifestyle choices, including: ? Daily care of your teeth and gums. ? Regular physical activity. ? Eating a healthy diet. ? Avoiding tobacco and drug use. ? Limiting alcohol use. ? Practicing safe sex. ? Taking low-dose aspirin every day starting at age 50. What happens during an annual well check? The services and screenings done by your health care provider during your annual well check will depend on your age, overall health, lifestyle risk factors, and family history of disease. Counseling Your health care provider may ask you questions about your:  Alcohol use.  Tobacco use.  Drug use.  Emotional well-being.  Home and relationship well-being.  Sexual activity.  Eating habits.  Work and work environment.  Screening You may have the following tests or measurements:  Height, weight, and BMI.  Blood pressure.  Lipid and cholesterol levels. These may be checked every 5 years, or more frequently if you are over 50 years old.  Skin check.  Lung cancer screening. You may have this screening every year starting at age 55 if you have a 30-pack-year history of smoking and currently smoke or have quit within the past 15 years.  Fecal occult blood test (FOBT) of the stool. You may have this test every year starting at age 50.  Flexible sigmoidoscopy or colonoscopy. You may have a sigmoidoscopy every 5 years or a colonoscopy every 10 years starting at age 50.  Prostate cancer screening. Recommendations will vary depending on your family history and other risks.  Hepatitis C  blood test.  Hepatitis B blood test.  Sexually transmitted disease (STD) testing.  Diabetes screening. This is done by checking your blood sugar (glucose) after you have not eaten for a while (fasting). You may have this done every 1-3 years.  Discuss your test results, treatment options, and if necessary, the need for more tests with your health care provider. Vaccines Your health care provider may recommend certain vaccines, such as:  Influenza vaccine. This is recommended every year.  Tetanus, diphtheria, and acellular pertussis (Tdap, Td) vaccine. You may need a Td booster every 10 years.  Varicella vaccine. You may need this if you have not been vaccinated.  Zoster vaccine. You may need this after age 60.  Measles, mumps, and rubella (MMR) vaccine. You may need at least one dose of MMR if you were born in 1957 or later. You may also need a second dose.  Pneumococcal 13-valent conjugate (PCV13) vaccine. You may need this if you have certain conditions and have not been vaccinated.  Pneumococcal polysaccharide (PPSV23) vaccine. You may need one or two doses if you smoke cigarettes or if you have certain conditions.  Meningococcal vaccine. You may need this if you have certain conditions.  Hepatitis A vaccine. You may need this if you have certain conditions or if you travel or work in places where you may be exposed to hepatitis A.  Hepatitis B vaccine. You may need this if you have certain conditions or if you travel or work in places where you may be exposed to hepatitis B.  Haemophilus influenzae type b (Hib) vaccine.   You may need this if you have certain risk factors.  Talk to your health care provider about which screenings and vaccines you need and how often you need them. This information is not intended to replace advice given to you by your health care provider. Make sure you discuss any questions you have with your health care provider. Document Released: 06/06/2015  Document Revised: 01/28/2016 Document Reviewed: 03/11/2015 Elsevier Interactive Patient Education  2017 [employer]  

## 2017-03-14 NOTE — Assessment & Plan Note (Signed)
Well controlled, no changes to meds. Encouraged heart healthy diet such as the DASH diet and exercise as tolerated.  °

## 2017-04-04 ENCOUNTER — Other Ambulatory Visit: Payer: BLUE CROSS/BLUE SHIELD

## 2017-04-08 ENCOUNTER — Other Ambulatory Visit: Payer: BLUE CROSS/BLUE SHIELD

## 2017-04-11 ENCOUNTER — Other Ambulatory Visit (INDEPENDENT_AMBULATORY_CARE_PROVIDER_SITE_OTHER): Payer: BLUE CROSS/BLUE SHIELD

## 2017-04-11 DIAGNOSIS — E782 Mixed hyperlipidemia: Secondary | ICD-10-CM

## 2017-04-11 DIAGNOSIS — I10 Essential (primary) hypertension: Secondary | ICD-10-CM | POA: Diagnosis not present

## 2017-04-11 DIAGNOSIS — D72819 Decreased white blood cell count, unspecified: Secondary | ICD-10-CM | POA: Diagnosis not present

## 2017-04-11 DIAGNOSIS — Z0000 Encounter for general adult medical examination without abnormal findings: Secondary | ICD-10-CM

## 2017-04-11 LAB — COMPREHENSIVE METABOLIC PANEL
ALBUMIN: 3.9 g/dL (ref 3.5–5.2)
ALT: 11 U/L (ref 0–53)
AST: 18 U/L (ref 0–37)
Alkaline Phosphatase: 46 U/L (ref 39–117)
BILIRUBIN TOTAL: 0.6 mg/dL (ref 0.2–1.2)
BUN: 14 mg/dL (ref 6–23)
CALCIUM: 9.8 mg/dL (ref 8.4–10.5)
CHLORIDE: 105 meq/L (ref 96–112)
CO2: 29 mEq/L (ref 19–32)
CREATININE: 0.97 mg/dL (ref 0.40–1.50)
GFR: 102.22 mL/min (ref >60.00)
Glucose, Bld: 105 mg/dL — ABNORMAL HIGH (ref 70–99)
Potassium: 3.6 mEq/L (ref 3.5–5.1)
Sodium: 140 mEq/L (ref 135–145)
Total Protein: 6.8 g/dL (ref 6.0–8.3)

## 2017-04-11 LAB — CBC WITH DIFFERENTIAL/PLATELET
BASOS PCT: 0.9 % (ref 0.0–3.0)
Basophils Absolute: 0 10*3/uL (ref 0.0–0.1)
EOS PCT: 2.5 % (ref 0.0–5.0)
Eosinophils Absolute: 0.1 10*3/uL (ref 0.0–0.7)
HEMATOCRIT: 41 % (ref 39.0–52.0)
HEMOGLOBIN: 13.6 g/dL (ref 13.0–17.0)
Lymphocytes Relative: 30.3 % (ref 12.0–46.0)
Lymphs Abs: 1.3 10*3/uL (ref 0.7–4.0)
MCHC: 33.2 g/dL (ref 30.0–36.0)
MCV: 91 fl (ref 78.0–100.0)
MONO ABS: 0.2 10*3/uL (ref 0.1–1.0)
MONOS PCT: 5.8 % (ref 3.0–12.0)
Neutro Abs: 2.6 10*3/uL (ref 1.4–7.7)
Neutrophils Relative %: 60.5 % (ref 43.0–77.0)
Platelets: 229 10*3/uL (ref 150.0–400.0)
RBC: 4.5 Mil/uL (ref 4.22–5.81)
RDW: 13.8 % (ref 11.5–15.5)
WBC: 4.3 10*3/uL (ref 4.0–10.5)

## 2017-04-11 LAB — LIPID PANEL
CHOL/HDL RATIO: 3
CHOLESTEROL: 141 mg/dL (ref 0–200)
HDL: 46.9 mg/dL (ref >39.00)
LDL CALC: 73 mg/dL (ref 0–99)
NonHDL: 93.62
TRIGLYCERIDES: 101 mg/dL (ref 0.0–149.0)
VLDL: 20.2 mg/dL (ref 0.0–40.0)

## 2017-04-11 LAB — HIV ANTIBODY (ROUTINE TESTING W REFLEX): HIV: NONREACTIVE

## 2017-04-11 LAB — PSA: PSA: 1.77 ng/mL (ref 0.10–4.00)

## 2017-04-11 LAB — TSH: TSH: 1.33 u[IU]/mL (ref 0.35–4.50)

## 2017-05-05 ENCOUNTER — Other Ambulatory Visit: Payer: Self-pay | Admitting: Family Medicine

## 2017-08-01 ENCOUNTER — Other Ambulatory Visit: Payer: Self-pay | Admitting: Family Medicine

## 2018-03-13 ENCOUNTER — Other Ambulatory Visit: Payer: BLUE CROSS/BLUE SHIELD

## 2018-03-20 ENCOUNTER — Encounter: Payer: BLUE CROSS/BLUE SHIELD | Admitting: Family Medicine

## 2018-05-05 ENCOUNTER — Other Ambulatory Visit: Payer: Self-pay | Admitting: Family Medicine

## 2018-05-22 ENCOUNTER — Other Ambulatory Visit: Payer: BLUE CROSS/BLUE SHIELD

## 2018-05-22 ENCOUNTER — Other Ambulatory Visit: Payer: Self-pay | Admitting: Emergency Medicine

## 2018-05-22 DIAGNOSIS — Z0000 Encounter for general adult medical examination without abnormal findings: Secondary | ICD-10-CM

## 2018-05-25 ENCOUNTER — Other Ambulatory Visit (INDEPENDENT_AMBULATORY_CARE_PROVIDER_SITE_OTHER): Payer: BLUE CROSS/BLUE SHIELD

## 2018-05-25 DIAGNOSIS — Z0000 Encounter for general adult medical examination without abnormal findings: Secondary | ICD-10-CM

## 2018-05-25 LAB — COMPREHENSIVE METABOLIC PANEL
ALK PHOS: 56 U/L (ref 39–117)
ALT: 13 U/L (ref 0–53)
AST: 20 U/L (ref 0–37)
Albumin: 4.3 g/dL (ref 3.5–5.2)
BILIRUBIN TOTAL: 0.5 mg/dL (ref 0.2–1.2)
BUN: 17 mg/dL (ref 6–23)
CO2: 30 mEq/L (ref 19–32)
Calcium: 9.3 mg/dL (ref 8.4–10.5)
Chloride: 103 mEq/L (ref 96–112)
Creatinine, Ser: 1.01 mg/dL (ref 0.40–1.50)
GFR: 97.18 mL/min (ref >60.00)
Glucose, Bld: 97 mg/dL (ref 70–99)
Potassium: 3.9 mEq/L (ref 3.5–5.1)
Sodium: 139 mEq/L (ref 135–145)
Total Protein: 7 g/dL (ref 6.0–8.3)

## 2018-05-25 LAB — PSA: PSA: 1.8 ng/mL (ref 0.10–4.00)

## 2018-05-25 LAB — LIPID PANEL
CHOLESTEROL: 170 mg/dL (ref 0–200)
HDL: 50.9 mg/dL (ref >39.00)
LDL Cholesterol: 105 mg/dL — ABNORMAL HIGH (ref 0–99)
NonHDL: 118.6
Total CHOL/HDL Ratio: 3
Triglycerides: 68 mg/dL (ref 0.0–149.0)
VLDL: 13.6 mg/dL (ref 0.0–40.0)

## 2018-05-25 LAB — TSH: TSH: 1.97 u[IU]/mL (ref 0.35–4.50)

## 2018-05-25 LAB — CBC
HEMATOCRIT: 41.1 % (ref 39.0–52.0)
HEMOGLOBIN: 14 g/dL (ref 13.0–17.0)
MCHC: 34 g/dL (ref 30.0–36.0)
MCV: 88.4 fl (ref 78.0–100.0)
Platelets: 228 10*3/uL (ref 150.0–400.0)
RBC: 4.65 Mil/uL (ref 4.22–5.81)
RDW: 13.7 % (ref 11.5–15.5)
WBC: 4.8 10*3/uL (ref 4.0–10.5)

## 2018-05-29 ENCOUNTER — Encounter: Payer: BLUE CROSS/BLUE SHIELD | Admitting: Family Medicine

## 2018-08-07 ENCOUNTER — Ambulatory Visit: Payer: BLUE CROSS/BLUE SHIELD | Admitting: Family Medicine

## 2018-08-07 ENCOUNTER — Ambulatory Visit (INDEPENDENT_AMBULATORY_CARE_PROVIDER_SITE_OTHER): Payer: BLUE CROSS/BLUE SHIELD | Admitting: Family Medicine

## 2018-08-07 ENCOUNTER — Other Ambulatory Visit: Payer: Self-pay

## 2018-08-07 ENCOUNTER — Encounter: Payer: Self-pay | Admitting: Family Medicine

## 2018-08-07 ENCOUNTER — Encounter: Payer: BLUE CROSS/BLUE SHIELD | Admitting: Family Medicine

## 2018-08-07 VITALS — BP 116/80 | HR 61 | Temp 98.0°F | Resp 16 | Ht 71.0 in | Wt 188.5 lb

## 2018-08-07 DIAGNOSIS — Z0000 Encounter for general adult medical examination without abnormal findings: Secondary | ICD-10-CM

## 2018-08-07 DIAGNOSIS — E782 Mixed hyperlipidemia: Secondary | ICD-10-CM

## 2018-08-07 DIAGNOSIS — I10 Essential (primary) hypertension: Secondary | ICD-10-CM

## 2018-08-07 DIAGNOSIS — R351 Nocturia: Secondary | ICD-10-CM

## 2018-08-07 DIAGNOSIS — D72819 Decreased white blood cell count, unspecified: Secondary | ICD-10-CM
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
## 2018-08-07 NOTE — Assessment & Plan Note (Signed)
Monitor cbc

## 2018-08-07 NOTE — Patient Instructions (Addendum)
Preventive Care 40-64 Years, Male Preventive care refers to lifestyle choices and visits with your health care provider that can promote health and wellness.  Shingrix is the new shingles shot, call insurance and confirm coverage. Document  What does preventive care include?   A yearly physical exam. This is also called an annual well check.  Dental exams once or twice a year.  Routine eye exams. Ask your health care provider how often you should have your eyes checked.  Personal lifestyle choices, including: ? Daily care of your teeth and gums. ? Regular physical activity. ? Eating a healthy diet. ? Avoiding tobacco and drug use. ? Limiting alcohol use. ? Practicing safe sex. ? Taking low-dose aspirin every day starting at age 60. What happens during an annual well check? The services and screenings done by your health care provider during your annual well check will depend on your age, overall health, lifestyle risk factors, and family history of disease. Counseling Your health care provider may ask you questions about your:  Alcohol use.  Tobacco use.  Drug use.  Emotional well-being.  Home and relationship well-being.  Sexual activity.  Eating habits.  Work and work Statistician. Screening You may have the following tests or measurements:  Height, weight, and BMI.  Blood pressure.  Lipid and cholesterol levels. These may be checked every 5 years, or more frequently if you are over 17 years old.  Skin check.  Lung cancer screening. You may have this screening every year starting at age 60 if you have a 30-pack-year history of smoking and currently smoke or have quit within the past 15 years.  Colorectal cancer screening. All adults should have this screening starting at age 60 and continuing until age 60. Your health care provider may recommend screening at age 22. You will have tests every 1-10 years, depending on your results and the type of screening test.  People at increased risk should start screening at an earlier age. Screening tests may include: ? Guaiac-based fecal occult blood testing. ? Fecal immunochemical test (FIT). ? Stool DNA test. ? Virtual colonoscopy. ? Sigmoidoscopy. During this test, a flexible tube with a tiny camera (sigmoidoscope) is used to examine your rectum and lower colon. The sigmoidoscope is inserted through your anus into your rectum and lower colon. ? Colonoscopy. During this test, a long, thin, flexible tube with a tiny camera (colonoscope) is used to examine your entire colon and rectum.  Prostate cancer screening. Recommendations will vary depending on your family history and other risks.  Hepatitis C blood test.  Hepatitis B blood test.  Sexually transmitted disease (STD) testing.  Diabetes screening. This is done by checking your blood sugar (glucose) after you have not eaten for a while (fasting). You may have this done every 1-3 years. Discuss your test results, treatment options, and if necessary, the need for more tests with your health care provider. Vaccines Your health care provider may recommend certain vaccines, such as:  Influenza vaccine. This is recommended every year.  Tetanus, diphtheria, and acellular pertussis (Tdap, Td) vaccine. You may need a Td booster every 10 years.  Varicella vaccine. You may need this if you have not been vaccinated.  Zoster vaccine. You may need this after age 60.  Measles, mumps, and rubella (MMR) vaccine. You may need at least one dose of MMR if you were born in 1957 or later. You may also need a second dose.  Pneumococcal 13-valent conjugate (PCV13) vaccine. You may need this if  you have certain conditions and have not been vaccinated.  Pneumococcal polysaccharide (PPSV23) vaccine. You may need one or two doses if you smoke cigarettes or if you have certain conditions.  Meningococcal vaccine. You may need this if you have certain conditions.  Hepatitis A  vaccine. You may need this if you have certain conditions or if you travel or work in places where you may be exposed to hepatitis A.  Hepatitis B vaccine. You may need this if you have certain conditions or if you travel or work in places where you may be exposed to hepatitis B.  Haemophilus influenzae type b (Hib) vaccine. You may need this if you have certain risk factors. Talk to your health care provider about which screenings and vaccines you need and how often you need them. This information is not intended to replace advice given to you by your health care provider. Make sure you discuss any questions you have with your health care provider. Document Released: 06/06/2015 Document Revised: 06/30/2017 Document Reviewed: 03/11/2015 Elsevier Interactive Patient Education  2019 [employer].

## 2018-08-07 NOTE — Assessment & Plan Note (Signed)
psa check today

## 2018-08-07 NOTE — Progress Notes (Signed)
Pre visit review using our clinic review tool, if applicable. No additional management support is needed unless otherwise documented below in the visit note. 

## 2018-08-07 NOTE — Assessment & Plan Note (Signed)
Well controlled, no changes to meds. Encouraged heart healthy diet such as the DASH diet and exercise as tolerated.  °

## 2018-08-07 NOTE — Assessment & Plan Note (Signed)
Patient encouraged to maintain heart healthy diet, regular exercise, adequate sleep. Consider daily probiotics. Take medications as prescribed 

## 2018-08-07 NOTE — Assessment & Plan Note (Signed)
Encouraged heart healthy diet, increase exercise, avoid trans fats, consider a krill oil cap daily 

## 2018-08-19 ENCOUNTER — Other Ambulatory Visit: Payer: Self-pay | Admitting: Family Medicine

## 2018-11-17 ENCOUNTER — Other Ambulatory Visit: Payer: Self-pay | Admitting: Family Medicine

## 2019-02-21 ENCOUNTER — Other Ambulatory Visit: Payer: Self-pay | Admitting: Family Medicine

## 2019-05-25 ENCOUNTER — Other Ambulatory Visit: Payer: Self-pay | Admitting: Family Medicine

## 2019-07-09 DIAGNOSIS — S6390XA Sprain of unspecified part of unspecified wrist and hand, initial encounter: Secondary | ICD-10-CM | POA: Diagnosis not present

## 2019-07-09 DIAGNOSIS — S0031XA Abrasion of nose, initial encounter: Secondary | ICD-10-CM | POA: Diagnosis not present

## 2019-08-03 ENCOUNTER — Other Ambulatory Visit: Payer: Self-pay

## 2019-08-06 ENCOUNTER — Other Ambulatory Visit: Payer: Self-pay

## 2019-08-06 ENCOUNTER — Other Ambulatory Visit (INDEPENDENT_AMBULATORY_CARE_PROVIDER_SITE_OTHER): Payer: BC Managed Care – PPO

## 2019-08-06 DIAGNOSIS — I10 Essential (primary) hypertension: Secondary | ICD-10-CM

## 2019-08-06 DIAGNOSIS — R351 Nocturia: Secondary | ICD-10-CM | POA: Diagnosis not present

## 2019-08-06 DIAGNOSIS — E782 Mixed hyperlipidemia: Secondary | ICD-10-CM

## 2019-08-06 LAB — COMPREHENSIVE METABOLIC PANEL
ALT: 13 U/L (ref 0–53)
AST: 19 U/L (ref 0–37)
Albumin: 4 g/dL (ref 3.5–5.2)
Alkaline Phosphatase: 63 U/L (ref 39–117)
BUN: 16 mg/dL (ref 6–23)
CO2: 29 mEq/L (ref 19–32)
Calcium: 9 mg/dL (ref 8.4–10.5)
Chloride: 104 mEq/L (ref 96–112)
Creatinine, Ser: 1 mg/dL (ref 0.40–1.50)
GFR: 92.11 mL/min (ref >60.00)
Glucose, Bld: 115 mg/dL — ABNORMAL HIGH (ref 70–99)
Potassium: 3.6 mEq/L (ref 3.5–5.1)
Sodium: 140 mEq/L (ref 135–145)
Total Bilirubin: 0.6 mg/dL (ref 0.2–1.2)
Total Protein: 6.8 g/dL (ref 6.0–8.3)

## 2019-08-06 LAB — PSA: PSA: 1.27 ng/mL (ref 0.10–4.00)

## 2019-08-06 LAB — TSH: TSH: 1.67 u[IU]/mL (ref 0.35–4.50)

## 2019-08-06 LAB — LIPID PANEL
Cholesterol: 152 mg/dL (ref 0–200)
HDL: 47.3 mg/dL (ref >39.00)
LDL Cholesterol: 92 mg/dL (ref 0–99)
NonHDL: 104.89
Total CHOL/HDL Ratio: 3
Triglycerides: 63 mg/dL (ref 0.0–149.0)
VLDL: 12.6 mg/dL (ref 0.0–40.0)

## 2019-08-06 LAB — CBC
HCT: 39.5 % (ref 39.0–52.0)
Hemoglobin: 13.6 g/dL (ref 13.0–17.0)
MCHC: 34.4 g/dL (ref 30.0–36.0)
MCV: 89.2 fl (ref 78.0–100.0)
Platelets: 202 10*3/uL (ref 150.0–400.0)
RBC: 4.43 Mil/uL (ref 4.22–5.81)
RDW: 13.7 % (ref 11.5–15.5)
WBC: 4.9 10*3/uL (ref 4.0–10.5)

## 2019-08-07 ENCOUNTER — Other Ambulatory Visit (INDEPENDENT_AMBULATORY_CARE_PROVIDER_SITE_OTHER): Payer: BC Managed Care – PPO

## 2019-08-07 DIAGNOSIS — R739 Hyperglycemia, unspecified: Secondary | ICD-10-CM

## 2019-08-07 LAB — HEMOGLOBIN A1C: Hgb A1c MFr Bld: 5.5 % (ref 4.6–6.5)

## 2019-08-07 NOTE — Addendum Note (Signed)
Addended by: Trenda Moots on: [id number] 09:41 AM   Modules accepted: Orders

## 2019-08-09 ENCOUNTER — Other Ambulatory Visit: Payer: Self-pay

## 2019-08-10 ENCOUNTER — Other Ambulatory Visit: Payer: Self-pay

## 2019-08-13 ENCOUNTER — Encounter: Payer: Self-pay | Admitting: Medical

## 2019-08-13 ENCOUNTER — Encounter: Payer: BLUE CROSS/BLUE SHIELD | Admitting: Family Medicine

## 2019-08-13 ENCOUNTER — Other Ambulatory Visit: Payer: Self-pay

## 2019-08-13 ENCOUNTER — Ambulatory Visit (INDEPENDENT_AMBULATORY_CARE_PROVIDER_SITE_OTHER): Payer: BC Managed Care – PPO | Admitting: Medical

## 2019-08-13 VITALS — BP 130/90 | HR 64 | Temp 95.3°F | Resp 18 | Ht 71.0 in | Wt 187.0 lb

## 2019-08-13 DIAGNOSIS — Z0000 Encounter for general adult medical examination without abnormal findings: Secondary | ICD-10-CM

## 2019-08-13 DIAGNOSIS — Z1211 Encounter for screening for malignant neoplasm of colon: Secondary | ICD-10-CM | POA: Diagnosis not present

## 2019-08-13 DIAGNOSIS — Z23 Encounter for immunization: Secondary | ICD-10-CM | POA: Diagnosis not present

## 2019-08-13 NOTE — Addendum Note (Signed)
Addended by: Jeronimo Greaves on: 08/13/2019 10:01 AM   Modules accepted: Orders
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
## 2019-08-13 NOTE — Patient Instructions (Addendum)
Wellness exam today  and labs look good.  Tdap vaccine give today.  Recommend exercise and healthy diet.  We will let you know lab results as they come in.  Follow up as regularly scheduled with pcp.  Referral for screening colonoscopy placed.  Advised bp is borderline at home checks. Today when I checked diastolic borderline. If bp increases then recommend increasing to amlodipine 5 mg daily.   Preventive Care 34-61 Years Old, Male Preventive care refers to lifestyle choices and visits with your health care provider that can promote health and wellness. This includes:  A yearly physical exam. This is also called an annual well check.  Regular dental and eye exams.  Immunizations.  Screening for certain conditions.  Healthy lifestyle choices, such as eating a healthy diet, getting regular exercise, not using drugs or products that contain nicotine and tobacco, and limiting alcohol use. What can I expect for my preventive care visit? Physical exam Your health care provider will check:  Height and weight. These may be used to calculate body mass index (BMI), which is a measurement that tells if you are at a healthy weight.  Heart rate and blood pressure.  Your skin for abnormal spots. Counseling Your health care provider may ask you questions about:  Alcohol, tobacco, and drug use.  Emotional well-being.  Home and relationship well-being.  Sexual activity.  Eating habits.  Work and work Statistician. What immunizations do I need?  Influenza (flu) vaccine  This is recommended every year. Tetanus, diphtheria, and pertussis (Tdap) vaccine  You may need a Td booster every 10 years. Varicella (chickenpox) vaccine  You may need this vaccine if you have not already been vaccinated. Zoster (shingles) vaccine  You may need this after age 70. Measles, mumps, and rubella (MMR) vaccine  You may need at least one dose of MMR if you were born in 1957 or later. You may  also need a second dose. Pneumococcal conjugate (PCV13) vaccine  You may need this if you have certain conditions and were not previously vaccinated. Pneumococcal polysaccharide (PPSV23) vaccine  You may need one or two doses if you smoke cigarettes or if you have certain conditions. Meningococcal conjugate (MenACWY) vaccine  You may need this if you have certain conditions. Hepatitis A vaccine  You may need this if you have certain conditions or if you travel or work in places where you may be exposed to hepatitis A. Hepatitis B vaccine  You may need this if you have certain conditions or if you travel or work in places where you may be exposed to hepatitis B. Haemophilus influenzae type b (Hib) vaccine  You may need this if you have certain risk factors. Human papillomavirus (HPV) vaccine  If recommended by your health care provider, you may need three doses over 6 months. You may receive vaccines as individual doses or as more than one vaccine together in one shot (combination vaccines). Talk with your health care provider about the risks and benefits of combination vaccines. What tests do I need? Blood tests  Lipid and cholesterol levels. These may be checked every 5 years, or more frequently if you are over 89 years old.  Hepatitis C test.  Hepatitis B test. Screening  Lung cancer screening. You may have this screening every year starting at age 23 if you have a 30-pack-year history of smoking and currently smoke or have quit within the past 15 years.  Prostate cancer screening. Recommendations will vary depending on your family history  and other risks.  Colorectal cancer screening. All adults should have this screening starting at age 37 and continuing until age 108. Your health care provider may recommend screening at age 61 if you are at increased risk. You will have tests every 1-10 years, depending on your results and the type of screening test.  Diabetes screening.  This is done by checking your blood sugar (glucose) after you have not eaten for a while (fasting). You may have this done every 1-3 years.  Sexually transmitted disease (STD) testing. Follow these instructions at home: Eating and drinking  Eat a diet that includes fresh fruits and vegetables, whole grains, lean protein, and low-fat dairy products.  Take vitamin and mineral supplements as recommended by your health care provider.  Do not drink alcohol if your health care provider tells you not to drink.  If you drink alcohol: ? Limit how much you have to 0-2 drinks a day. ? Be aware of how much alcohol is in your drink. In the [place] one drink equals one 12 oz bottle of beer (355 mL), one 5 oz glass of wine (148 mL), or one 1 oz glass of hard liquor (44 mL). Lifestyle  Take daily care of your teeth and gums.  Stay active. Exercise for at least 30 minutes on 5 or more days each week.  Do not use any products that contain nicotine or tobacco, such as cigarettes, e-cigarettes, and chewing tobacco. If you need help quitting, ask your health care provider.  If you are sexually active, practice safe sex. Use a condom or other form of protection to prevent STIs (sexually transmitted infections).  Talk with your health care provider about taking a low-dose aspirin every day starting at age 6. What's next?  Go to your health care provider once a year for a well check visit.  Ask your health care provider how often you should have your eyes and teeth checked.  Stay up to date on all vaccines. This information is not intended to replace advice given to you by your health care provider. Make sure you discuss any questions you have with your health care provider. Document Revised: 05/04/2018 Document Reviewed: 05/04/2018 Elsevier Patient Education  2020 [employer].

## 2019-08-25 ENCOUNTER — Other Ambulatory Visit: Payer: Self-pay | Admitting: Family Medicine

## 2020-02-17 ENCOUNTER — Other Ambulatory Visit: Payer: Self-pay | Admitting: Family Medicine

## 2020-05-04 ENCOUNTER — Encounter (HOSPITAL_BASED_OUTPATIENT_CLINIC_OR_DEPARTMENT_OTHER): Payer: Self-pay

## 2020-05-04 ENCOUNTER — Emergency Department (HOSPITAL_BASED_OUTPATIENT_CLINIC_OR_DEPARTMENT_OTHER)
Admission: EM | Admit: 2020-05-04 | Discharge: 2020-05-04 | Disposition: A | Discharge status: Home / Self Care | Payer: BC Managed Care – PPO | Attending: Emergency Medicine | Admitting: Emergency Medicine

## 2020-05-04 ENCOUNTER — Other Ambulatory Visit: Payer: Self-pay

## 2020-05-04 DIAGNOSIS — Z20822 Contact with and (suspected) exposure to covid-19: Secondary | ICD-10-CM | POA: Insufficient documentation

## 2020-05-04 DIAGNOSIS — R059 Cough, unspecified: Secondary | ICD-10-CM | POA: Diagnosis not present

## 2020-05-04 DIAGNOSIS — Z79899 Other long term (current) drug therapy: Secondary | ICD-10-CM | POA: Insufficient documentation

## 2020-05-04 DIAGNOSIS — I10 Essential (primary) hypertension: Secondary | ICD-10-CM | POA: Insufficient documentation

## 2020-05-04 DIAGNOSIS — J069 Acute upper respiratory infection, unspecified: Secondary | ICD-10-CM | POA: Diagnosis not present

## 2020-05-04 LAB — RESP PANEL BY RT-PCR (FLU A&B, COVID) ARPGX2
Influenza A by PCR: NEGATIVE
Influenza B by PCR: NEGATIVE
SARS Coronavirus 2 by RT PCR: NEGATIVE

## 2020-05-04 LAB — GROUP A STREP BY PCR: Group A Strep by PCR: NOT DETECTED

## 2020-05-04 NOTE — Discharge Instructions (Signed)
Seen here for nasal congestion and a sore throat.  Exam and lab work all looks reassuring.  I recommend over-the-counter pain medications like ibuprofen or Tylenol every 6 as needed please follow dosing back of bottle. may try home remedies like throat lodging's and tea with honey which can help with your sore throat.  Recommend following up with your PCP in 1 week's time if symptoms have not resolved.  Come back to the emergency department if you develop chest pain, shortness of breath, severe abdominal pain, uncontrolled nausea, vomiting, diarrhea.
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
## 2020-05-04 NOTE — ED Triage Notes (Signed)
He c/o uri sx and sore throat (mainly sore throat) x 2 days. He is in no distress.

## 2020-05-22 ENCOUNTER — Other Ambulatory Visit: Payer: Self-pay | Admitting: Family Medicine

## 2020-07-07 ENCOUNTER — Ambulatory Visit: Payer: BC Managed Care – PPO | Admitting: Family Medicine

## 2020-07-14 DIAGNOSIS — M25562 Pain in left knee: Secondary | ICD-10-CM | POA: Diagnosis not present

## 2020-08-19 ENCOUNTER — Other Ambulatory Visit: Payer: Self-pay | Admitting: Family Medicine

## 2020-09-13 DIAGNOSIS — I10 Essential (primary) hypertension: Secondary | ICD-10-CM | POA: Diagnosis not present

## 2020-09-13 DIAGNOSIS — R0981 Nasal congestion: Secondary | ICD-10-CM | POA: Diagnosis not present

## 2020-09-13 DIAGNOSIS — J069 Acute upper respiratory infection, unspecified: Secondary | ICD-10-CM | POA: Diagnosis not present

## 2020-09-13 DIAGNOSIS — R059 Cough, unspecified: Secondary | ICD-10-CM | POA: Diagnosis not present

## 2020-09-23 ENCOUNTER — Encounter: Payer: BC Managed Care – PPO | Admitting: Family Medicine

## 2020-09-25 ENCOUNTER — Ambulatory Visit (INDEPENDENT_AMBULATORY_CARE_PROVIDER_SITE_OTHER): Payer: BC Managed Care – PPO | Admitting: Medical

## 2020-09-25 ENCOUNTER — Other Ambulatory Visit: Payer: Self-pay

## 2020-09-25 VITALS — BP 134/80 | HR 66 | Temp 98.2°F | Resp 18 | Ht 71.0 in | Wt 187.0 lb

## 2020-09-25 DIAGNOSIS — Z0000 Encounter for general adult medical examination without abnormal findings: Secondary | ICD-10-CM | POA: Diagnosis not present

## 2020-09-25 DIAGNOSIS — R739 Hyperglycemia, unspecified: Secondary | ICD-10-CM | POA: Diagnosis not present

## 2020-09-25 DIAGNOSIS — Z125 Encounter for screening for malignant neoplasm of prostate: Secondary | ICD-10-CM

## 2020-09-25 LAB — CBC WITH DIFFERENTIAL/PLATELET
Basophils Absolute: 0 10*3/uL (ref 0.0–0.1)
Basophils Relative: 0.9 % (ref 0.0–3.0)
Eosinophils Absolute: 0.2 10*3/uL (ref 0.0–0.7)
Eosinophils Relative: 4.7 % (ref 0.0–5.0)
HCT: 40.5 % (ref 39.0–52.0)
Hemoglobin: 13.6 g/dL (ref 13.0–17.0)
Lymphocytes Relative: 33.6 % (ref 12.0–46.0)
Lymphs Abs: 1.6 10*3/uL (ref 0.7–4.0)
MCHC: 33.6 g/dL (ref 30.0–36.0)
MCV: 88.8 fl (ref 78.0–100.0)
Monocytes Absolute: 0.3 10*3/uL (ref 0.1–1.0)
Monocytes Relative: 6.4 % (ref 3.0–12.0)
Neutro Abs: 2.6 10*3/uL (ref 1.4–7.7)
Neutrophils Relative %: 54.4 % (ref 43.0–77.0)
Platelets: 216 10*3/uL (ref 150.0–400.0)
RBC: 4.56 Mil/uL (ref 4.22–5.81)
RDW: 13.8 % (ref 11.5–15.5)
WBC: 4.7 10*3/uL (ref 4.0–10.5)

## 2020-09-25 LAB — COMPREHENSIVE METABOLIC PANEL
ALT: 13 U/L (ref 0–53)
AST: 20 U/L (ref 0–37)
Albumin: 4.3 g/dL (ref 3.5–5.2)
Alkaline Phosphatase: 62 U/L (ref 39–117)
BUN: 13 mg/dL (ref 6–23)
CO2: 31 mEq/L (ref 19–32)
Calcium: 9.3 mg/dL (ref 8.4–10.5)
Chloride: 105 mEq/L (ref 96–112)
Creatinine, Ser: 0.97 mg/dL (ref 0.40–1.50)
GFR: 84.26 mL/min (ref >60.00)
Glucose, Bld: 85 mg/dL (ref 70–99)
Potassium: 4.2 mEq/L (ref 3.5–5.1)
Sodium: 141 mEq/L (ref 135–145)
Total Bilirubin: 0.8 mg/dL (ref 0.2–1.2)
Total Protein: 7.2 g/dL (ref 6.0–8.3)

## 2020-09-25 LAB — LIPID PANEL
Cholesterol: 168 mg/dL (ref 0–200)
HDL: 51.3 mg/dL (ref >39.00)
LDL Cholesterol: 99 mg/dL (ref 0–99)
NonHDL: 116.29
Total CHOL/HDL Ratio: 3
Triglycerides: 86 mg/dL (ref 0.0–149.0)
VLDL: 17.2 mg/dL (ref 0.0–40.0)

## 2020-09-25 LAB — HEMOGLOBIN A1C: Hgb A1c MFr Bld: 5.9 % (ref 4.6–6.5)

## 2020-09-25 LAB — PSA: PSA: 2.96 ng/mL (ref 0.10–4.00)

## 2020-09-25 MED ORDER — BENZONATATE 100 MG PO CAPS: 100.0000 mg | ORAL_CAPSULE | Freq: Three times a day (TID) | ORAL | 0 refills | Status: DC | PRN

## 2020-09-25 NOTE — Patient Instructions (Addendum)
For you wellness exam today I have ordered cbc, cmp,psa and  lipid panel.  Vaccine up to date and will get shingrix end of this month.  a1c check as well due to mild elevated sugar in past.  Recommend exercise and healthy diet.  We will let you know lab results as they come in.  Follow up date appointment will be determined after lab review.   You appear to be much improved with bronchitis. Residual cough presently. I think will taper off over next week. If not or worsens then get cxr. Continue benzonatate as needed.   Preventive Care 18-48 Years Old, Male Preventive care refers to lifestyle choices and visits with your health care provider that can promote health and wellness. This includes:  A yearly physical exam. This is also called an annual wellness visit.  Regular dental and eye exams.  Immunizations.  Screening for certain conditions.  Healthy lifestyle choices, such as: ? Eating a healthy diet. ? Getting regular exercise. ? Not using drugs or products that contain nicotine and tobacco. ? Limiting alcohol use. What can I expect for my preventive care visit? Physical exam Your health care provider will check your:  Height and weight. These may be used to calculate your BMI (body mass index). BMI is a measurement that tells if you are at a healthy weight.  Heart rate and blood pressure.  Body temperature.  Skin for abnormal spots. Counseling Your health care provider may ask you questions about your:  Past medical problems.  Family's medical history.  Alcohol, tobacco, and drug use.  Emotional well-being.  Home life and relationship well-being.  Sexual activity.  Diet, exercise, and sleep habits.  Work and work Statistician.  Access to firearms. What immunizations do I need? Vaccines are usually given at various ages, according to a schedule. Your health care provider will recommend vaccines for you based on your age, medical history, and lifestyle  or other factors, such as travel or where you work.   What tests do I need? Blood tests  Lipid and cholesterol levels. These may be checked every 5 years, or more often if you are over 65 years old.  Hepatitis C test.  Hepatitis B test. Screening  Lung cancer screening. You may have this screening every year starting at age 40 if you have a 30-pack-year history of smoking and currently smoke or have quit within the past 15 years.  Prostate cancer screening. Recommendations will vary depending on your family history and other risks.  Genital exam to check for testicular cancer or hernias.  Colorectal cancer screening. ? All adults should have this screening starting at age 52 and continuing until age 34. ? Your health care provider may recommend screening at age 43 if you are at increased risk. ? You will have tests every 1-10 years, depending on your results and the type of screening test.  Diabetes screening. ? This is done by checking your blood sugar (glucose) after you have not eaten for a while (fasting). ? You may have this done every 1-3 years.  STD (sexually transmitted disease) testing, if you are at risk. Follow these instructions at home: Eating and drinking  Eat a diet that includes fresh fruits and vegetables, whole grains, lean protein, and low-fat dairy products.  Take vitamin and mineral supplements as recommended by your health care provider.  Do not drink alcohol if your health care provider tells you not to drink.  If you drink alcohol: ? Limit how much  you have to 0-2 drinks a day. ? Be aware of how much alcohol is in your drink. In the [place] one drink equals one 12 oz bottle of beer (355 mL), one 5 oz glass of wine (148 mL), or one 1 oz glass of hard liquor (44 mL).   Lifestyle  Take daily care of your teeth and gums. Brush your teeth every morning and night with fluoride toothpaste. Floss one time each day.  Stay active. Exercise for at least 30  minutes 5 or more days each week.  Do not use any products that contain nicotine or tobacco, such as cigarettes, e-cigarettes, and chewing tobacco. If you need help quitting, ask your health care provider.  Do not use drugs.  If you are sexually active, practice safe sex. Use a condom or other form of protection to prevent STIs (sexually transmitted infections).  If told by your health care provider, take low-dose aspirin daily starting at age 30.  Find healthy ways to cope with stress, such as: ? Meditation, yoga, or listening to music. ? Journaling. ? Talking to a trusted person. ? Spending time with friends and family. Safety  Always wear your seat belt while driving or riding in a vehicle.  Do not drive: ? If you have been drinking alcohol. Do not ride with someone who has been drinking. ? When you are tired or distracted. ? While texting.  Wear a helmet and other protective equipment during sports activities.  If you have firearms in your house, make sure you follow all gun safety procedures. What's next?  Go to your health care provider once a year for an annual wellness visit.  Ask your health care provider how often you should have your eyes and teeth checked.  Stay up to date on all vaccines. This information is not intended to replace advice given to you by your health care provider. Make sure you discuss any questions you have with your health care provider. Document Revised: 02/06/2019 Document Reviewed: 05/04/2018 Elsevier Patient Education  2021 [employer].
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
## 2020-11-26 ENCOUNTER — Other Ambulatory Visit: Payer: Self-pay | Admitting: Family Medicine

## 2020-12-07 DIAGNOSIS — R059 Cough, unspecified: Secondary | ICD-10-CM | POA: Diagnosis not present

## 2020-12-07 DIAGNOSIS — I10 Essential (primary) hypertension: Secondary | ICD-10-CM | POA: Diagnosis not present

## 2020-12-07 DIAGNOSIS — Z20822 Contact with and (suspected) exposure to covid-19: Secondary | ICD-10-CM | POA: Diagnosis not present

## 2020-12-07 DIAGNOSIS — U071 COVID-19: Secondary | ICD-10-CM | POA: Diagnosis not present

## 2021-01-12 ENCOUNTER — Ambulatory Visit: Payer: BC Managed Care – PPO | Admitting: Family Medicine

## 2021-01-12 ENCOUNTER — Other Ambulatory Visit: Payer: Self-pay

## 2021-01-12 ENCOUNTER — Ambulatory Visit (HOSPITAL_BASED_OUTPATIENT_CLINIC_OR_DEPARTMENT_OTHER)
Admission: RE | Admit: 2021-01-12 | Discharge: 2021-01-12 | Disposition: A | Discharge status: Home / Self Care | Payer: BC Managed Care – PPO | Source: Ambulatory Visit | Attending: Family Medicine | Admitting: Family Medicine

## 2021-01-12 ENCOUNTER — Encounter: Payer: Self-pay | Admitting: Family Medicine

## 2021-01-12 VITALS — BP 132/84 | HR 64 | Temp 98.1°F | Resp 16 | Wt 189.2 lb

## 2021-01-12 DIAGNOSIS — E049 Nontoxic goiter, unspecified: Secondary | ICD-10-CM

## 2021-01-12 DIAGNOSIS — R059 Cough, unspecified: Secondary | ICD-10-CM

## 2021-01-12 DIAGNOSIS — E782 Mixed hyperlipidemia: Secondary | ICD-10-CM | POA: Diagnosis not present

## 2021-01-12 DIAGNOSIS — I10 Essential (primary) hypertension: Secondary | ICD-10-CM

## 2021-01-12 DIAGNOSIS — T7840XA Allergy, unspecified, initial encounter: Secondary | ICD-10-CM

## 2021-01-12 MED ORDER — METHYLPREDNISOLONE 4 MG PO TABS: ORAL_TABLET | ORAL | 0 refills | Status: DC

## 2021-01-12 MED ORDER — PROMETHAZINE-DM 6.25-15 MG/5ML PO SYRP: 2.5000 mL | ORAL_SOLUTION | Freq: Three times a day (TID) | ORAL | 0 refills | Status: DC | PRN

## 2021-01-12 NOTE — Assessment & Plan Note (Signed)
Well controlled, no changes to meds. Encouraged heart healthy diet such as the DASH diet and exercise as tolerated.  °

## 2021-01-12 NOTE — Assessment & Plan Note (Signed)
Encourage heart healthy diet such as MIND or DASH diet, increase exercise, avoid trans fats, simple carbohydrates and processed foods, consider a krill or fish or flaxseed oil cap daily.  °

## 2021-01-12 NOTE — Patient Instructions (Addendum)
Paxlovid is the new COVID medication we can give you if you get COVID so make sure you test if you have symptoms because we have to treat by day 5 of symptoms for it to be effective. If you are positive let us know so we can treat. If a home test is negative and your symptoms are persistent get a PCR test. Can check testing locations at [email or web address] If you are positive we will make an appointment with [place] and we will send in Paxlovid if you would like it. Check with your pharmacy before we meet to confirm they have it in stock, if they do not then we can get the prescription at the [place]   Shingrix is the new shingles shot, 2 shots over 2-6 months, confirm coverage with insurance and document, then can return here for shots with nurse appt or at pharmacy    Cough, Adult Coughing is a reflex that clears your throat and your airways (respiratory system). Coughing helps to heal and protect your lungs. It is normal to cough occasionally, but a cough that happens with other symptoms or lasts a long time may be a sign of a condition that needs treatment. An acute cough may only last2-3 weeks, while a chronic cough may last 8 or more weeks. Coughing is commonly caused by: Infection of the respiratory systemby viruses or bacteria. Breathing in substances that irritate your lungs. Allergies. Asthma. Mucus that runs down the back of your throat (postnasal drip). Smoking. Acid backing up from the stomach into the esophagus (gastroesophageal reflux). Certain medicines. Chronic lung problems. Other medical conditions such as heart failure or a blood clot in the lung (pulmonary embolism). Follow these instructions at home: Medicines Take over-the-counter and prescription medicines only as told by your health care provider. Talk with your health care provider before you take a cough suppressant medicine. Lifestyle  Avoid cigarette smoke. Do not use any products that contain  nicotine or tobacco, such as cigarettes, e-cigarettes, and chewing tobacco. If you need help quitting, ask your health care provider. Drink enough fluid to keep your urine pale yellow. Avoid caffeine. Do not drink alcohol if your health care provider tells you not to drink.  General instructions  Pay close attention to changes in your cough. Tell your health care provider about them. Always cover your mouth when you cough. Avoid things that make you cough, such as perfume, candles, cleaning products, or campfire or tobacco smoke. If the air is dry, use a cool mist vaporizer or humidifier in your bedroom or your home to help loosen secretions. If your cough is worse at night, try to sleep in a semi-upright position. Rest as needed. Keep all follow-up visits as told by your health care provider. This is important.  Contact a health care provider if you: Have new symptoms. Cough up pus. Have a cough that does not get better after 2-3 weeks or gets worse. Cannot control your cough with cough suppressant medicines and you are losing sleep. Have pain that gets worse or pain that is not helped with medicine. Have a fever. Have unexplained weight loss. Have night sweats. Get help right away if: You cough up blood. You have difficulty breathing. Your heartbeat is very fast. These symptoms may represent a serious problem that is an emergency. Do not wait to see if the symptoms will go away. Get medical help right away. Call your local emergency services (911 in the [place] Do not drive yourself to  the hospital. Summary Coughing is a reflex that clears your throat and your airways. It is normal to cough occasionally, but a cough that happens with other symptoms or lasts a long time may be a sign of a condition that needs treatment. Take over-the-counter and prescription medicines only as told by your health care provider. Always cover your mouth when you cough. Contact a health care provider if  you have new symptoms or a cough that does not get better after 2-3 weeks or gets worse. This information is not intended to replace advice given to you by your health care provider. Make sure you discuss any questions you have with your healthcare provider. Document Revised: 05/29/2018 Document Reviewed: 05/29/2018 Elsevier Patient Education  [place].
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
## 2021-01-14 DIAGNOSIS — R059 Cough, unspecified: Secondary | ICD-10-CM | POA: Insufficient documentation

## 2021-01-14 DIAGNOSIS — E049 Nontoxic goiter, unspecified: Secondary | ICD-10-CM | POA: Insufficient documentation

## 2021-01-14 NOTE — Assessment & Plan Note (Signed)
Antihistamine bid and flonase daily

## 2021-01-14 NOTE — Assessment & Plan Note (Signed)
Check thyroid ultrasound

## 2021-01-14 NOTE — Assessment & Plan Note (Signed)
He had a bad cold in march and then a couple of months ago he had COVID. He has struggled with cough now for a couple of months now. Halls cough drops helped temporarily. CXR is negative. He is given a Medrol dospak and Promethazine DM if the cough does not resolve he is asked to consider treatment for silent heartburn and referral to ENT and Gastroenterology for further evaluation. He will report any worsening symptoms.

## 2021-01-19 ENCOUNTER — Other Ambulatory Visit: Payer: Self-pay

## 2021-01-19 ENCOUNTER — Ambulatory Visit (HOSPITAL_BASED_OUTPATIENT_CLINIC_OR_DEPARTMENT_OTHER)
Admission: RE | Admit: 2021-01-19 | Discharge: 2021-01-19 | Disposition: A | Discharge status: Home / Self Care | Payer: BC Managed Care – PPO | Source: Ambulatory Visit | Attending: Family Medicine | Admitting: Family Medicine

## 2021-01-19 DIAGNOSIS — R059 Cough, unspecified: Secondary | ICD-10-CM | POA: Diagnosis not present

## 2021-01-19 DIAGNOSIS — E049 Nontoxic goiter, unspecified: Secondary | ICD-10-CM | POA: Diagnosis not present

## 2021-01-19 DIAGNOSIS — E010 Iodine-deficiency related diffuse (endemic) goiter: Secondary | ICD-10-CM | POA: Diagnosis not present

## 2021-03-12 ENCOUNTER — Other Ambulatory Visit: Payer: Self-pay | Admitting: Family Medicine

## 2021-05-14 ENCOUNTER — Ambulatory Visit: Payer: BC Managed Care – PPO | Admitting: Family Medicine

## 2021-10-26 ENCOUNTER — Encounter: Payer: BC Managed Care – PPO | Admitting: Family Medicine

## 2022-01-21 ENCOUNTER — Encounter: Payer: Self-pay | Admitting: Internal Medicine

## 2022-01-26 ENCOUNTER — Other Ambulatory Visit: Payer: Self-pay

## 2022-01-26 ENCOUNTER — Telehealth: Payer: Self-pay | Admitting: Family Medicine

## 2022-01-26 MED ORDER — AMLODIPINE BESYLATE 2.5 MG PO TABS: ORAL_TABLET | ORAL | 1 refills | Status: DC

## 2022-01-26 NOTE — Telephone Encounter (Signed)
Medication:   amLODipine (NORVASC) 2.5 MG tablet [[id number]]  Has the patient contacted their pharmacy? No. (If no, request that the patient contact the pharmacy for the refill.) (If yes, when and what did the pharmacy advise?)  Preferred Pharmacy (with phone number or street name):   [id number] - [place], [place] - [address] AT [address]  [address], [place] [address]  Phone:  [phone]  Fax:  [phone]   Agent: Please be advised that RX refills may take up to 3 business days. We ask that you follow-up with your pharmacy.

## 2022-02-03 DIAGNOSIS — R519 Headache, unspecified: Secondary | ICD-10-CM | POA: Diagnosis not present

## 2022-02-22 ENCOUNTER — Encounter: Payer: BC Managed Care – PPO | Admitting: Family Medicine

## 2022-03-19 ENCOUNTER — Encounter: Payer: BC Managed Care – PPO | Admitting: Family Medicine

## 2022-03-23 IMAGING — US US THYROID
1 series · 14 of 23 positions shown · non-contrast
Comparison: None.

CLINICAL DATA: Goiter.

Thyromegaly
Cough
EXAM:
THYROID ULTRASOUND
TECHNIQUE: Ultrasound examination of the thyroid gland and adjacent soft
tissues was performed.

[Series 1: us thyroid · 23 acquisitions, 14 frames shown]
[im 1/23]
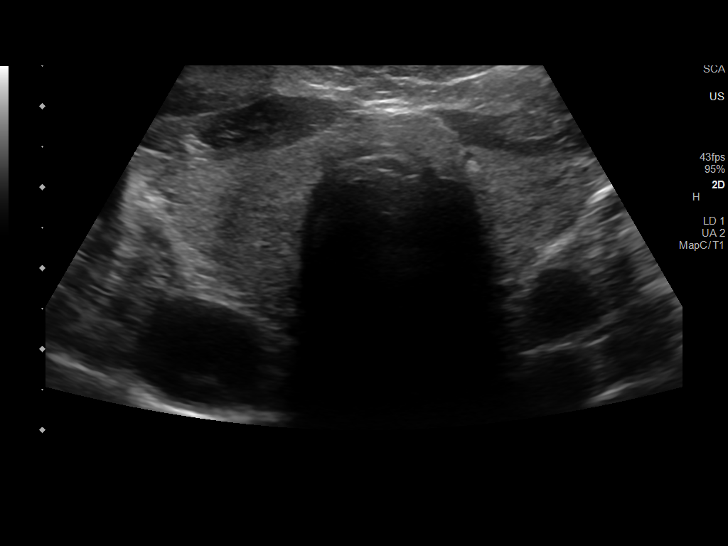
[im 3/23]
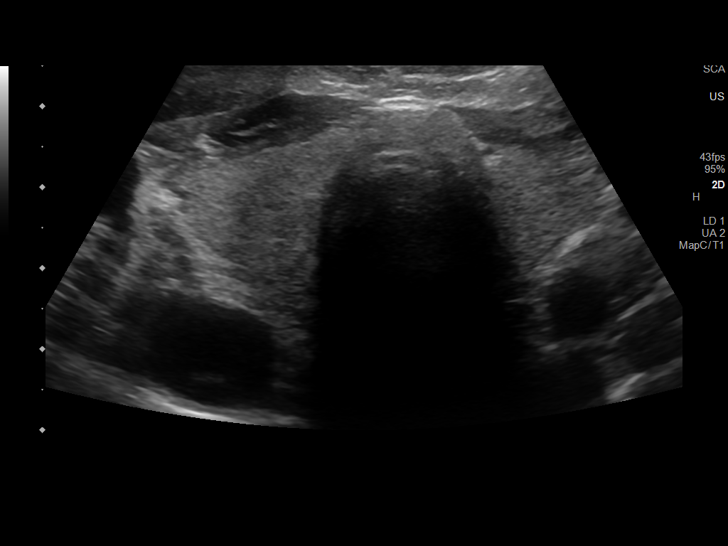
[im 5/23]
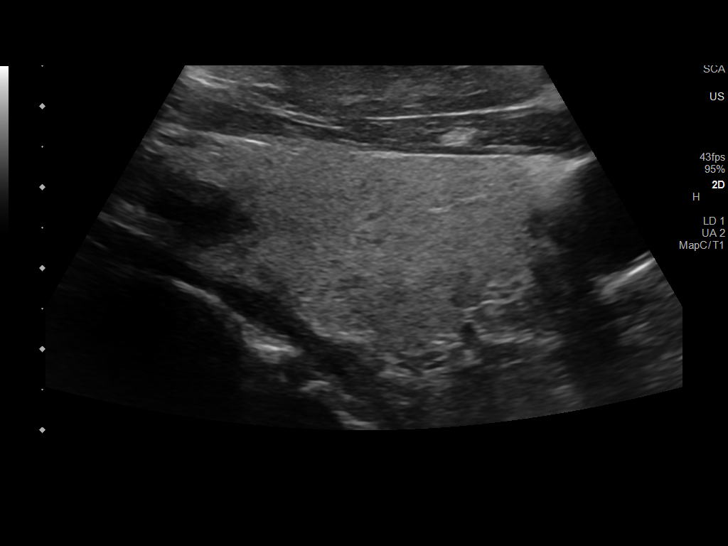
[im 6/23]
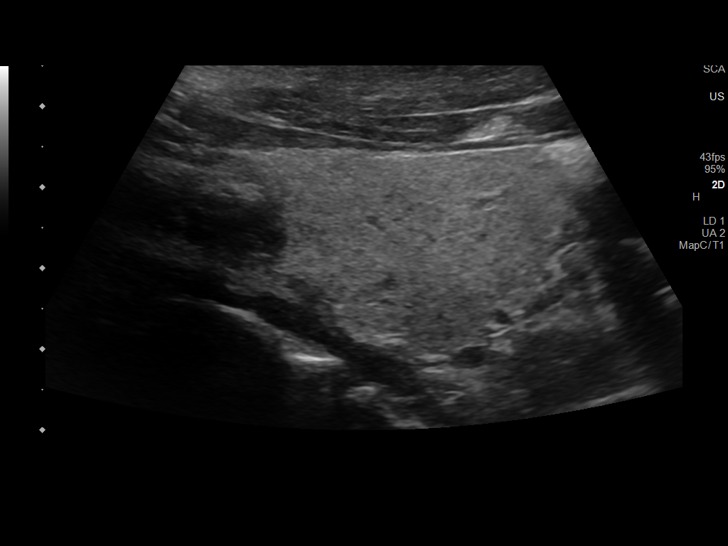
[im 8/23]
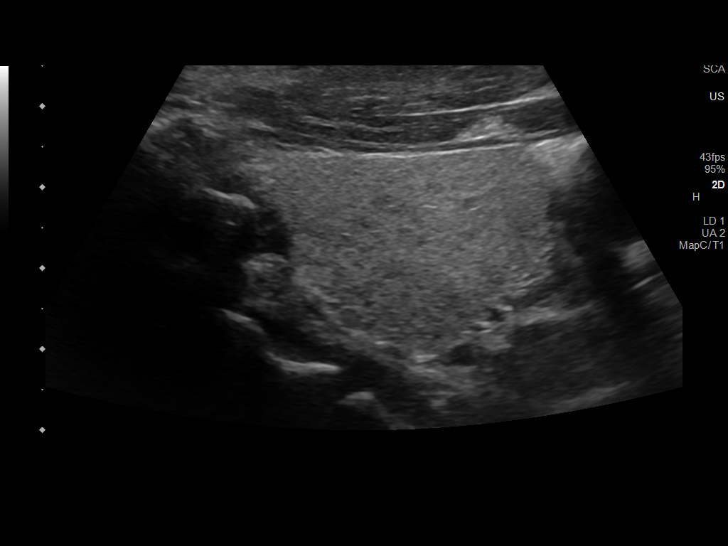
[im 10/23]
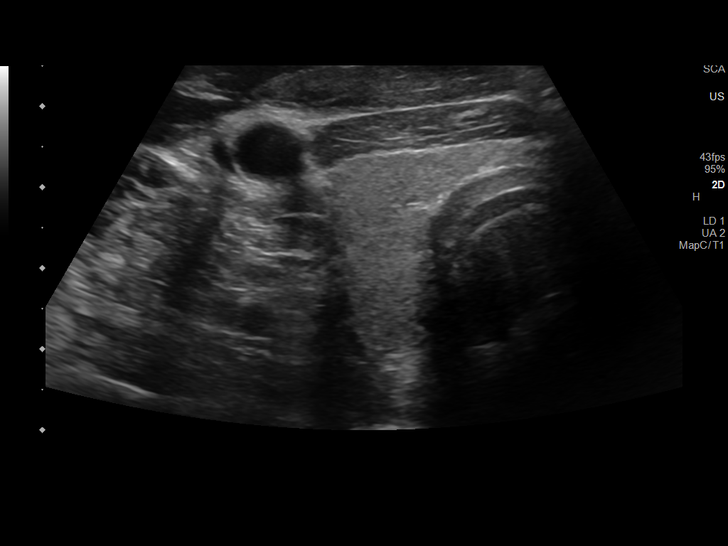
[im 11/23]
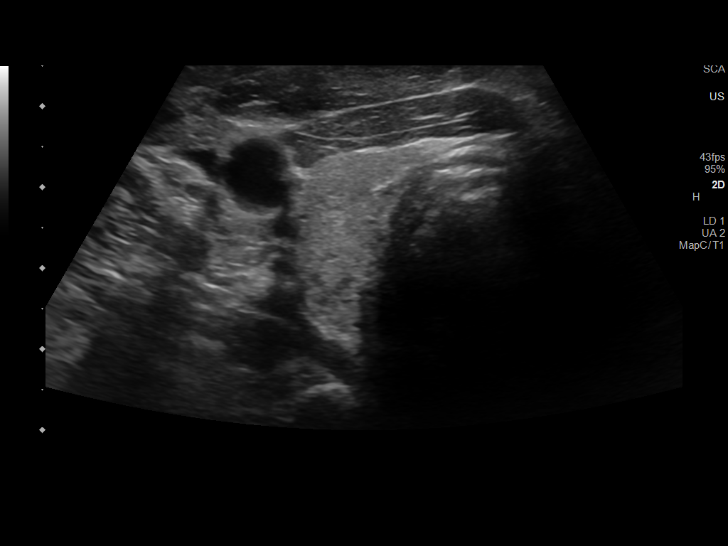
[im 13/23]
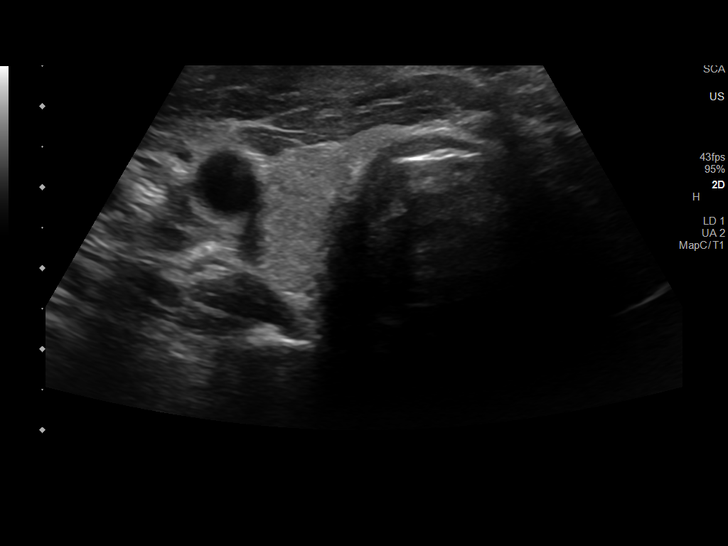
[im 14/23]
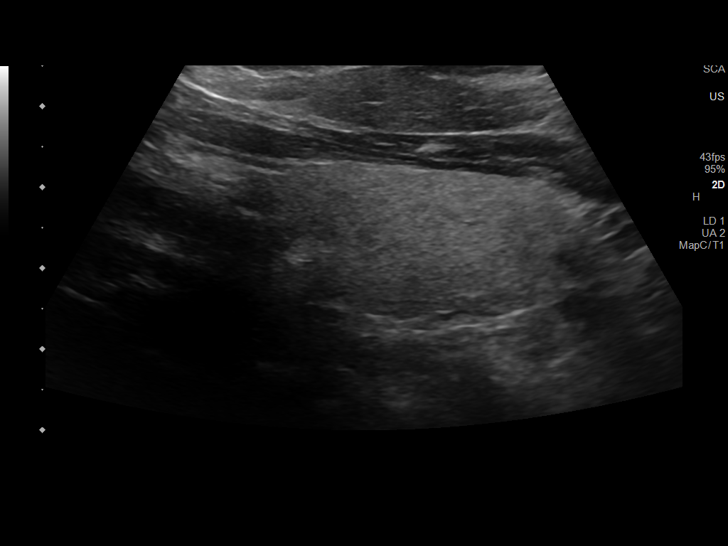
[im 16/23]
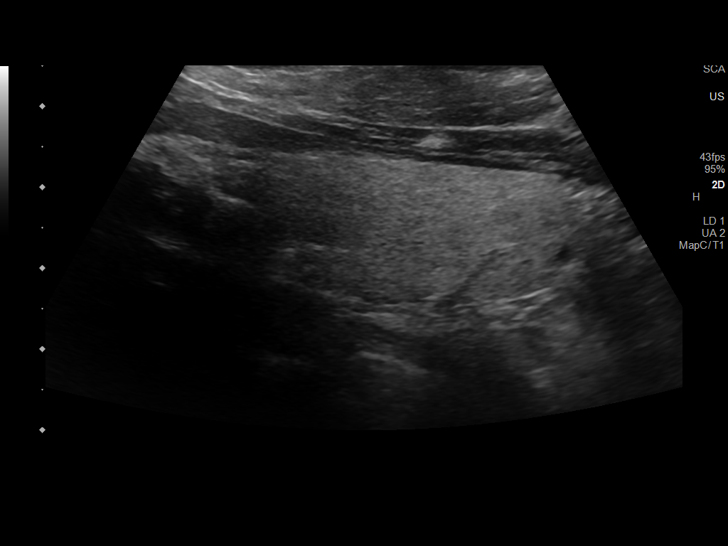
[im 18/23]
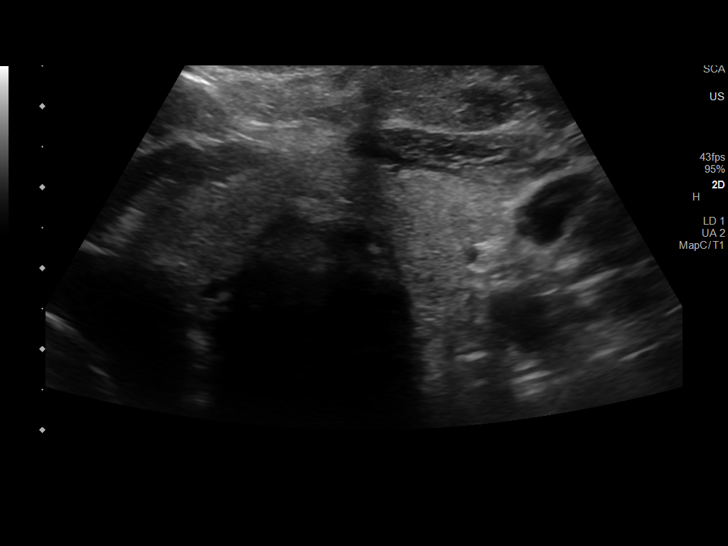
[im 19/23]
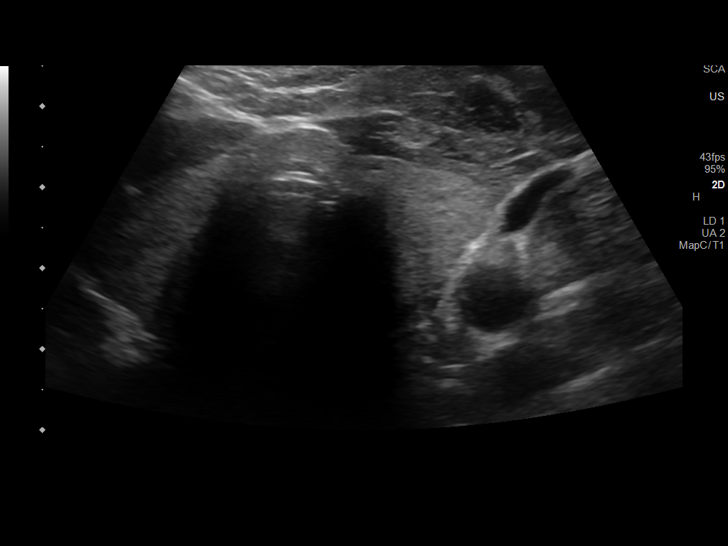
[im 21/23]
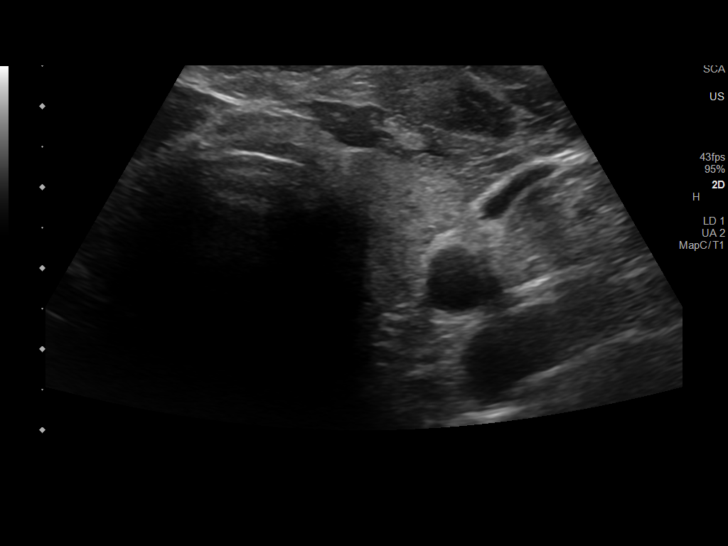
[im 23/23]
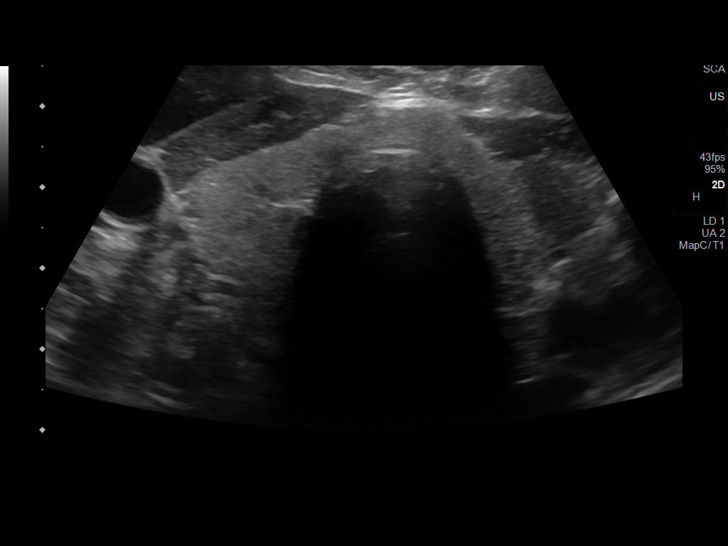

[14 of 23 positions shown; findings below may reference images not displayed]

FINDINGS: Parenchymal Echotexture: Normal

Isthmus: 0.6 cm

Right lobe: 4.0 x 2.6 x 1.3 cm

Left lobe: 3.9 x 2.0 x 1.4 cm

_________________________________________________________

Estimated total number of nodules >/= 1 cm: 0

Number of spongiform nodules >/=  2 cm not described below (TR1): 0

Number of mixed cystic and solid nodules >/= 1.5 cm not described
below (TR2): 0

_________________________________________________________

No discrete nodules are seen within the thyroid gland.
IMPRESSION: Normal thyroid ultrasound.

The above is in keeping with the ACR TI-RADS recommendations - [HOSPITAL] 1686;[DATE].

## 2022-07-27 ENCOUNTER — Other Ambulatory Visit: Payer: Self-pay | Admitting: Family Medicine

## 2022-10-24 NOTE — Assessment & Plan Note (Signed)
Patient encouraged to maintain heart healthy diet, regular exercise, adequate sleep. Consider daily probiotics. Take medications as prescribed. Labs ordered and reviewed. Given and reviewed copy of ACP documents from [employer] and encouraged to complete and return  Colonoscopy August 2013

## 2022-10-24 NOTE — Assessment & Plan Note (Signed)
Encourage heart healthy diet such as MIND or DASH diet, increase exercise, avoid trans fats, simple carbohydrates and processed foods, consider a krill or fish or flaxseed oil cap daily.  °

## 2022-10-24 NOTE — Assessment & Plan Note (Signed)
Well controlled, no changes to meds. Encouraged heart healthy diet such as the DASH diet and exercise as tolerated.  °

## 2022-10-25 ENCOUNTER — Encounter: Payer: Self-pay | Admitting: Family Medicine

## 2022-10-25 ENCOUNTER — Other Ambulatory Visit: Payer: Self-pay

## 2022-10-25 ENCOUNTER — Ambulatory Visit (INDEPENDENT_AMBULATORY_CARE_PROVIDER_SITE_OTHER): Payer: BC Managed Care – PPO | Admitting: Family Medicine

## 2022-10-25 VITALS — BP 130/74 | HR 66 | Temp 98.0°F | Resp 16 | Ht 71.0 in | Wt 185.0 lb

## 2022-10-25 DIAGNOSIS — I10 Essential (primary) hypertension: Secondary | ICD-10-CM

## 2022-10-25 DIAGNOSIS — Z1211 Encounter for screening for malignant neoplasm of colon: Secondary | ICD-10-CM | POA: Diagnosis not present

## 2022-10-25 DIAGNOSIS — Z0000 Encounter for general adult medical examination without abnormal findings: Secondary | ICD-10-CM | POA: Diagnosis not present

## 2022-10-25 DIAGNOSIS — R351 Nocturia: Secondary | ICD-10-CM

## 2022-10-25 DIAGNOSIS — E782 Mixed hyperlipidemia: Secondary | ICD-10-CM | POA: Diagnosis not present

## 2022-10-25 LAB — CBC WITH DIFFERENTIAL/PLATELET
Basophils Absolute: 0 10*3/uL (ref 0.0–0.1)
Basophils Relative: 0.9 % (ref 0.0–3.0)
Eosinophils Absolute: 0.2 10*3/uL (ref 0.0–0.7)
Eosinophils Relative: 5.6 % — ABNORMAL HIGH (ref 0.0–5.0)
HCT: 40.1 % (ref 39.0–52.0)
Hemoglobin: 13 g/dL (ref 13.0–17.0)
Lymphocytes Relative: 30.1 % (ref 12.0–46.0)
Lymphs Abs: 1.3 10*3/uL (ref 0.7–4.0)
MCHC: 32.6 g/dL (ref 30.0–36.0)
MCV: 91.9 fl (ref 78.0–100.0)
Monocytes Absolute: 0.3 10*3/uL (ref 0.1–1.0)
Monocytes Relative: 8.2 % (ref 3.0–12.0)
Neutro Abs: 2.3 10*3/uL (ref 1.4–7.7)
Neutrophils Relative %: 55.2 % (ref 43.0–77.0)
Platelets: 227 10*3/uL (ref 150.0–400.0)
RBC: 4.36 Mil/uL (ref 4.22–5.81)
RDW: 14.2 % (ref 11.5–15.5)
WBC: 4.2 10*3/uL (ref 4.0–10.5)

## 2022-10-25 LAB — COMPREHENSIVE METABOLIC PANEL
ALT: 9 U/L (ref 0–53)
AST: 16 U/L (ref 0–37)
Albumin: 4.1 g/dL (ref 3.5–5.2)
Alkaline Phosphatase: 65 U/L (ref 39–117)
BUN: 16 mg/dL (ref 6–23)
CO2: 29 mEq/L (ref 19–32)
Calcium: 9.1 mg/dL (ref 8.4–10.5)
Chloride: 104 mEq/L (ref 96–112)
Creatinine, Ser: 1.01 mg/dL (ref 0.40–1.50)
GFR: 79.11 mL/min (ref >60.00)
Glucose, Bld: 87 mg/dL (ref 70–99)
Potassium: 3.8 mEq/L (ref 3.5–5.1)
Sodium: 141 mEq/L (ref 135–145)
Total Bilirubin: 0.7 mg/dL (ref 0.2–1.2)
Total Protein: 6.8 g/dL (ref 6.0–8.3)

## 2022-10-25 LAB — PSA: PSA: 2.18 ng/mL (ref 0.10–4.00)

## 2022-10-25 LAB — LIPID PANEL
Cholesterol: 148 mg/dL (ref 0–200)
HDL: 43.1 mg/dL (ref >39.00)
LDL Cholesterol: 94 mg/dL (ref 0–99)
NonHDL: 105.3
Total CHOL/HDL Ratio: 3
Triglycerides: 58 mg/dL (ref 0.0–149.0)
VLDL: 11.6 mg/dL (ref 0.0–40.0)

## 2022-10-25 LAB — TSH: TSH: 1.5 u[IU]/mL (ref 0.35–5.50)

## 2022-10-25 MED ORDER — AMLODIPINE BESYLATE 2.5 MG PO TABS: ORAL_TABLET | ORAL | 1 refills | Status: DC

## 2022-10-25 NOTE — Patient Instructions (Signed)
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
## 2023-05-01 ENCOUNTER — Other Ambulatory Visit: Payer: Self-pay | Admitting: Family Medicine

## 2023-08-02 ENCOUNTER — Other Ambulatory Visit: Payer: Self-pay | Admitting: Family Medicine

## 2023-09-26 DIAGNOSIS — J019 Acute sinusitis, unspecified: Secondary | ICD-10-CM | POA: Diagnosis not present

## 2023-10-23 NOTE — Assessment & Plan Note (Deleted)
Patient encouraged to maintain heart healthy diet, regular exercise, adequate sleep. Consider daily probiotics. Take medications as prescribed. Labs ordered and reviewed. Given and reviewed copy of ACP documents from [employer] and encouraged to complete and return  Colonoscopy August 2013

## 2023-10-23 NOTE — Assessment & Plan Note (Deleted)
 Encourage heart healthy diet such as MIND or DASH diet, increase exercise, avoid trans fats, simple carbohydrates and processed foods, consider a krill or fish or flaxseed oil cap daily.

## 2023-10-23 NOTE — Assessment & Plan Note (Deleted)
 Well controlled, no changes to meds. Encouraged heart healthy diet such as the DASH diet and exercise as tolerated.

## 2023-10-27 ENCOUNTER — Encounter: Payer: BC Managed Care – PPO | Admitting: Family Medicine

## 2023-10-27 DIAGNOSIS — E782 Mixed hyperlipidemia: Secondary | ICD-10-CM

## 2023-10-27 DIAGNOSIS — I10 Essential (primary) hypertension: Secondary | ICD-10-CM

## 2023-10-27 DIAGNOSIS — Z0000 Encounter for general adult medical examination without abnormal findings: Secondary | ICD-10-CM

## 2024-02-11 ENCOUNTER — Other Ambulatory Visit: Payer: Self-pay | Admitting: Family Medicine

## 2024-02-13 NOTE — Telephone Encounter (Signed)
 Rx sent.

## 2024-03-11 NOTE — Assessment & Plan Note (Signed)
 Encourage heart healthy diet such as MIND or DASH diet, increase exercise, avoid trans fats, simple carbohydrates and processed foods, consider a krill or fish or flaxseed oil cap daily.

## 2024-03-11 NOTE — Assessment & Plan Note (Signed)
Patient encouraged to maintain heart healthy diet, regular exercise, adequate sleep. Consider daily probiotics. Take medications as prescribed. Labs ordered and reviewed. Given and reviewed copy of ACP documents from [employer] and encouraged to complete and return  Colonoscopy August 2013
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
## 2024-03-11 NOTE — Assessment & Plan Note (Signed)
 Well controlled, no changes to meds. Encouraged heart healthy diet such as the DASH diet and exercise as tolerated.

## 2024-03-12 ENCOUNTER — Ambulatory Visit: Payer: Self-pay | Admitting: Family Medicine

## 2024-03-12 ENCOUNTER — Ambulatory Visit (INDEPENDENT_AMBULATORY_CARE_PROVIDER_SITE_OTHER): Admitting: Family Medicine

## 2024-03-12 VITALS — BP 142/82 | HR 63 | Temp 98.1°F | Resp 18 | Ht 71.0 in | Wt 185.0 lb

## 2024-03-12 DIAGNOSIS — E782 Mixed hyperlipidemia: Secondary | ICD-10-CM | POA: Diagnosis not present

## 2024-03-12 DIAGNOSIS — I10 Essential (primary) hypertension: Secondary | ICD-10-CM

## 2024-03-12 DIAGNOSIS — Z0000 Encounter for general adult medical examination without abnormal findings: Secondary | ICD-10-CM | POA: Diagnosis not present

## 2024-03-12 DIAGNOSIS — Z23 Encounter for immunization: Secondary | ICD-10-CM

## 2024-03-12 DIAGNOSIS — R739 Hyperglycemia, unspecified: Secondary | ICD-10-CM | POA: Diagnosis not present

## 2024-03-12 DIAGNOSIS — R351 Nocturia: Secondary | ICD-10-CM | POA: Diagnosis not present

## 2024-03-12 DIAGNOSIS — Z1211 Encounter for screening for malignant neoplasm of colon: Secondary | ICD-10-CM

## 2024-03-12 LAB — COMPREHENSIVE METABOLIC PANEL WITH GFR
ALT: 10 U/L (ref 0–53)
AST: 17 U/L (ref 0–37)
Albumin: 4.2 g/dL (ref 3.5–5.2)
Alkaline Phosphatase: 60 U/L (ref 39–117)
BUN: 16 mg/dL (ref 6–23)
CO2: 31 meq/L (ref 19–32)
Calcium: 8.9 mg/dL (ref 8.4–10.5)
Chloride: 104 meq/L (ref 96–112)
Creatinine, Ser: 0.97 mg/dL (ref 0.40–1.50)
GFR: 82.24 mL/min (ref >60.00)
Glucose, Bld: 83 mg/dL (ref 70–99)
Potassium: 4 meq/L (ref 3.5–5.1)
Sodium: 141 meq/L (ref 135–145)
Total Bilirubin: 0.7 mg/dL (ref 0.2–1.2)
Total Protein: 6.6 g/dL (ref 6.0–8.3)

## 2024-03-12 LAB — CBC WITH DIFFERENTIAL/PLATELET
Basophils Absolute: 0 K/uL (ref 0.0–0.1)
Basophils Relative: 0.9 % (ref 0.0–3.0)
Eosinophils Absolute: 0.1 K/uL (ref 0.0–0.7)
Eosinophils Relative: 3.4 % (ref 0.0–5.0)
HCT: 40.8 % (ref 39.0–52.0)
Hemoglobin: 13.5 g/dL (ref 13.0–17.0)
Lymphocytes Relative: 34.9 % (ref 12.0–46.0)
Lymphs Abs: 1.2 K/uL (ref 0.7–4.0)
MCHC: 33.1 g/dL (ref 30.0–36.0)
MCV: 90.3 fl (ref 78.0–100.0)
Monocytes Absolute: 0.2 K/uL (ref 0.1–1.0)
Monocytes Relative: 7.2 % (ref 3.0–12.0)
Neutro Abs: 1.8 K/uL (ref 1.4–7.7)
Neutrophils Relative %: 53.6 % (ref 43.0–77.0)
Platelets: 218 K/uL (ref 150.0–400.0)
RBC: 4.52 Mil/uL (ref 4.22–5.81)
RDW: 13.6 % (ref 11.5–15.5)
WBC: 3.4 K/uL — ABNORMAL LOW (ref 4.0–10.5)

## 2024-03-12 LAB — TSH: TSH: 1.48 u[IU]/mL (ref 0.35–5.50)

## 2024-03-12 LAB — LIPID PANEL
Cholesterol: 158 mg/dL (ref 0–200)
HDL: 43.8 mg/dL (ref >39.00)
LDL Cholesterol: 99 mg/dL (ref 0–99)
NonHDL: 113.74
Total CHOL/HDL Ratio: 4
Triglycerides: 73 mg/dL (ref 0.0–149.0)
VLDL: 14.6 mg/dL (ref 0.0–40.0)

## 2024-03-12 LAB — HEMOGLOBIN A1C: Hgb A1c MFr Bld: 5.9 % (ref 4.6–6.5)

## 2024-03-12 LAB — PSA: PSA: 1.86 ng/mL (ref 0.10–4.00)

## 2024-03-12 MED ORDER — AMLODIPINE BESYLATE 5 MG PO TABS: 5.0000 mg | ORAL_TABLET | Freq: Every day | ORAL | 1 refills | Status: AC

## 2024-03-12 NOTE — Patient Instructions (Addendum)
 Prevnar 20 and RSV, Respiratory Syncitial Virus Vaccine, Arexvy   [facility] vaccine clinics M-F 9-4  Preventive Care 15-65 Years Old, Male Preventive care refers to lifestyle choices and visits with your health care provider that can promote health and wellness. Preventive care visits are also called wellness exams. What can I expect for my preventive care visit? Counseling During your preventive care visit, your health care provider may ask about your: Medical history, including: Past medical problems. Family medical history. Current health, including: Emotional well-being. Home life and relationship well-being. Sexual activity. Lifestyle, including: Alcohol, nicotine or tobacco, and drug use. Access to firearms. Diet, exercise, and sleep habits. Safety issues such as seatbelt and bike helmet use. Sunscreen use. Work and work Astronomer. Physical exam Your health care provider will check your: Height and weight. These may be used to calculate your BMI (body mass index). BMI is a measurement that tells if you are at a healthy weight. Waist circumference. This measures the distance around your waistline. This measurement also tells if you are at a healthy weight and may help predict your risk of certain diseases, such as type 2 diabetes and high blood pressure. Heart rate and blood pressure. Body temperature. Skin for abnormal spots. What immunizations do I need?  Vaccines are usually given at various ages, according to a schedule. Your health care provider will recommend vaccines for you based on your age, medical history, and lifestyle or other factors, such as travel or where you work. What tests do I need? Screening Your health care provider may recommend screening tests for certain conditions. This may include: Lipid and cholesterol levels. Diabetes screening. This is done by checking your blood sugar (glucose) after you have not eaten for a while  (fasting). Hepatitis B test. Hepatitis C test. HIV (human immunodeficiency virus) test. STI (sexually transmitted infection) testing, if you are at risk. Lung cancer screening. Prostate cancer screening. Colorectal cancer screening. Talk with your health care provider about your test results, treatment options, and if necessary, the need for more tests. Follow these instructions at home: Eating and drinking  Eat a diet that includes fresh fruits and vegetables, whole grains, lean protein, and low-fat dairy products. Take vitamin and mineral supplements as recommended by your health care provider. Do not drink alcohol if your health care provider tells you not to drink. If you drink alcohol: Limit how much you have to 0-2 drinks a day. Know how much alcohol is in your drink. In the [place] one drink equals one 12 oz bottle of beer (355 mL), one 5 oz glass of wine (148 mL), or one 1 oz glass of hard liquor (44 mL). Lifestyle Brush your teeth every morning and night with fluoride toothpaste. Floss one time each day. Exercise for at least 30 minutes 5 or more days each week. Do not use any products that contain nicotine or tobacco. These products include cigarettes, chewing tobacco, and vaping devices, such as e-cigarettes. If you need help quitting, ask your health care provider. Do not use drugs. If you are sexually active, practice safe sex. Use a condom or other form of protection to prevent STIs. Take aspirin only as told by your health care provider. Make sure that you understand how much to take and what form to take. Work with your health care provider to find out whether it is safe and beneficial for you to take aspirin daily. Find healthy ways to manage stress, such as: Meditation, yoga, or  listening to music. Journaling. Talking to a trusted person. Spending time with friends and family. Minimize exposure to UV radiation to reduce your risk of skin cancer. Safety Always wear  your seat belt while driving or riding in a vehicle. Do not drive: If you have been drinking alcohol. Do not ride with someone who has been drinking. When you are tired or distracted. While texting. If you have been using any mind-altering substances or drugs. Wear a helmet and other protective equipment during sports activities. If you have firearms in your house, make sure you follow all gun safety procedures. What's next? Go to your health care provider once a year for an annual wellness visit. Ask your health care provider how often you should have your eyes and teeth checked. Stay up to date on all vaccines. This information is not intended to replace advice given to you by your health care provider. Make sure you discuss any questions you have with your health care provider. Document Revised: 11/05/2020 Document Reviewed: 11/05/2020 Elsevier Patient Education  2024 [employer].
